# Patient Record
Sex: Male | Born: 1950 | Race: Black or African American | Hispanic: No | Marital: Single | Smoking: Current every day smoker
Health system: Southern US, Community
[De-identification: ages and names within clinical notes are randomized; demographics above are authoritative.]

## PROBLEM LIST (undated history)

## (undated) DIAGNOSIS — I10 Essential (primary) hypertension: Secondary | ICD-10-CM

## (undated) DIAGNOSIS — J449 Chronic obstructive pulmonary disease, unspecified: Secondary | ICD-10-CM

## (undated) DIAGNOSIS — J40 Bronchitis, not specified as acute or chronic: Secondary | ICD-10-CM

## (undated) DIAGNOSIS — R0600 Dyspnea, unspecified: Secondary | ICD-10-CM

## (undated) DIAGNOSIS — M1990 Unspecified osteoarthritis, unspecified site: Secondary | ICD-10-CM

## (undated) HISTORY — PX: NO PAST SURGERIES: SHX2092

---

## 1998-02-15 ENCOUNTER — Emergency Department (HOSPITAL_COMMUNITY): Admission: EM | Admit: 1998-02-15 | Discharge: 1998-02-15 | Payer: Self-pay | Admitting: Emergency Medicine

## 2020-07-08 ENCOUNTER — Ambulatory Visit: Payer: Self-pay | Attending: Internal Medicine

## 2020-07-08 DIAGNOSIS — Z23 Encounter for immunization: Secondary | ICD-10-CM

## 2020-07-08 NOTE — Progress Notes (Signed)
   Covid-19 Vaccination Clinic  Name:  Masahiro Iglesia    MRN: [id number] DOB: 1951-01-21  07/08/2020  Mr. Rabideau was observed post Covid-19 immunization for 15 minutes without incident. He was provided with Vaccine Information Sheet and instruction to access the V-Safe system.   Mr. Tanori was instructed to call 911 with any severe reactions post vaccine: Marland Kitchen Difficulty breathing  . Swelling of face and throat  . A fast heartbeat  . A bad rash all over body  . Dizziness and weakness   Immunizations Administered    Name Date Dose VIS Date Route   Pfizer COVID-19 Vaccine 07/08/2020  9:33 AM 0.3 mL 11/26/2018 Intramuscular   Manufacturer: [employer], [employer]   Lot: [id number]   NDC: [address]-0

## 2020-07-29 ENCOUNTER — Ambulatory Visit: Payer: Self-pay | Attending: Internal Medicine

## 2020-07-29 DIAGNOSIS — Z23 Encounter for immunization: Secondary | ICD-10-CM

## 2020-07-29 NOTE — Progress Notes (Signed)
   Covid-19 Vaccination Clinic  Name:  Evertt Chouinard    MRN: [id number] DOB: 12-21-1950  07/29/2020  Mr. Lowdermilk was observed post Covid-19 immunization for 15 minutes without incident. He was provided with Vaccine Information Sheet and instruction to access the V-Safe system.   Mr. Mayabb was instructed to call 911 with any severe reactions post vaccine: Marland Kitchen Difficulty breathing  . Swelling of face and throat  . A fast heartbeat  . A bad rash all over body  . Dizziness and weakness   Immunizations Administered    Name Date Dose VIS Date Route   Pfizer COVID-19 Vaccine 07/29/2020  9:14 AM 0.3 mL 11/26/2018 Intramuscular   Manufacturer: [employer], [employer]   Lot: [id number]   NDC: [address]-7

## 2021-02-07 DIAGNOSIS — Z20822 Contact with and (suspected) exposure to covid-19: Secondary | ICD-10-CM | POA: Diagnosis not present

## 2021-02-21 DIAGNOSIS — Z20822 Contact with and (suspected) exposure to covid-19: Secondary | ICD-10-CM | POA: Diagnosis not present

## 2021-03-03 DIAGNOSIS — Z20822 Contact with and (suspected) exposure to covid-19: Secondary | ICD-10-CM | POA: Diagnosis not present

## 2021-04-05 DIAGNOSIS — Z20822 Contact with and (suspected) exposure to covid-19: Secondary | ICD-10-CM | POA: Diagnosis not present

## 2021-04-07 DIAGNOSIS — Z20822 Contact with and (suspected) exposure to covid-19: Secondary | ICD-10-CM | POA: Diagnosis not present

## 2021-04-25 DIAGNOSIS — Z20822 Contact with and (suspected) exposure to covid-19: Secondary | ICD-10-CM | POA: Diagnosis not present

## 2021-05-02 DIAGNOSIS — Z20822 Contact with and (suspected) exposure to covid-19: Secondary | ICD-10-CM | POA: Diagnosis not present

## 2021-05-09 DIAGNOSIS — Z20822 Contact with and (suspected) exposure to covid-19: Secondary | ICD-10-CM | POA: Diagnosis not present

## 2021-06-20 DIAGNOSIS — Z20822 Contact with and (suspected) exposure to covid-19: Secondary | ICD-10-CM | POA: Diagnosis not present

## 2021-06-29 DIAGNOSIS — Z1152 Encounter for screening for COVID-19: Secondary | ICD-10-CM | POA: Diagnosis not present

## 2021-08-14 ENCOUNTER — Other Ambulatory Visit: Payer: Self-pay

## 2021-08-14 ENCOUNTER — Observation Stay (HOSPITAL_COMMUNITY)
Admission: EM | Admit: 2021-08-14 | Discharge: 2021-08-15 | Disposition: A | Discharge status: Home / Self Care | Payer: Medicare HMO | Attending: Internal Medicine | Admitting: Internal Medicine

## 2021-08-14 ENCOUNTER — Emergency Department (HOSPITAL_COMMUNITY): Payer: Medicare HMO

## 2021-08-14 ENCOUNTER — Encounter (HOSPITAL_COMMUNITY): Payer: Self-pay

## 2021-08-14 DIAGNOSIS — R030 Elevated blood-pressure reading, without diagnosis of hypertension: Secondary | ICD-10-CM | POA: Insufficient documentation

## 2021-08-14 DIAGNOSIS — J209 Acute bronchitis, unspecified: Secondary | ICD-10-CM | POA: Insufficient documentation

## 2021-08-14 DIAGNOSIS — R0902 Hypoxemia: Secondary | ICD-10-CM | POA: Diagnosis not present

## 2021-08-14 DIAGNOSIS — J441 Chronic obstructive pulmonary disease with (acute) exacerbation: Secondary | ICD-10-CM | POA: Diagnosis not present

## 2021-08-14 DIAGNOSIS — Z20822 Contact with and (suspected) exposure to covid-19: Secondary | ICD-10-CM | POA: Diagnosis not present

## 2021-08-14 DIAGNOSIS — J9601 Acute respiratory failure with hypoxia: Secondary | ICD-10-CM | POA: Diagnosis not present

## 2021-08-14 DIAGNOSIS — I10 Essential (primary) hypertension: Secondary | ICD-10-CM | POA: Diagnosis not present

## 2021-08-14 DIAGNOSIS — R0602 Shortness of breath: Secondary | ICD-10-CM | POA: Diagnosis present

## 2021-08-14 DIAGNOSIS — I700 Atherosclerosis of aorta: Secondary | ICD-10-CM | POA: Diagnosis not present

## 2021-08-14 DIAGNOSIS — R918 Other nonspecific abnormal finding of lung field: Secondary | ICD-10-CM | POA: Diagnosis not present

## 2021-08-14 LAB — RESPIRATORY PANEL BY PCR

## 2021-08-14 LAB — BASIC METABOLIC PANEL
Anion gap: 8 (ref 5–15)
BUN: 25 mg/dL — ABNORMAL HIGH (ref 8–23)
CO2: 30 mmol/L (ref 22–32)
Calcium: 9.7 mg/dL (ref 8.9–10.3)
Chloride: 103 mmol/L (ref 98–111)
Creatinine, Ser: 1.11 mg/dL (ref 0.61–1.24)
GFR, Estimated: >60 mL/min (ref >60)
Glucose, Bld: 114 mg/dL — ABNORMAL HIGH (ref 70–99)
Potassium: 3.6 mmol/L (ref 3.5–5.1)
Sodium: 141 mmol/L (ref 135–145)

## 2021-08-14 LAB — CBC WITH DIFFERENTIAL/PLATELET
Abs Immature Granulocytes: 0.01 10*3/uL (ref 0.00–0.07)
Basophils Absolute: 0 10*3/uL (ref 0.0–0.1)
Basophils Relative: 0 %
Eosinophils Absolute: 0.2 10*3/uL (ref 0.0–0.5)
Eosinophils Relative: 5 %
HCT: 52.5 % — ABNORMAL HIGH (ref 39.0–52.0)
Hemoglobin: 17.2 g/dL — ABNORMAL HIGH (ref 13.0–17.0)
Immature Granulocytes: 0 %
Lymphocytes Relative: 51 %
Lymphs Abs: 2.5 10*3/uL (ref 0.7–4.0)
MCH: 29.9 pg (ref 26.0–34.0)
MCHC: 32.8 g/dL (ref 30.0–36.0)
MCV: 91.3 fL (ref 80.0–100.0)
Monocytes Absolute: 0.5 10*3/uL (ref 0.1–1.0)
Monocytes Relative: 10 %
Neutro Abs: 1.6 10*3/uL — ABNORMAL LOW (ref 1.7–7.7)
Neutrophils Relative %: 34 %
Platelets: 312 10*3/uL (ref 150–400)
RBC: 5.75 MIL/uL (ref 4.22–5.81)
RDW: 14.4 % (ref 11.5–15.5)
WBC: 4.9 10*3/uL (ref 4.0–10.5)
nRBC: 0 % (ref 0.0–0.2)

## 2021-08-14 LAB — RESP PANEL BY RT-PCR (FLU A&B, COVID) ARPGX2
Influenza A by PCR: NEGATIVE
Influenza B by PCR: NEGATIVE
SARS Coronavirus 2 by RT PCR: NEGATIVE

## 2021-08-14 LAB — LIPID PANEL
Cholesterol: 210 mg/dL — ABNORMAL HIGH (ref 0–200)
HDL: 68 mg/dL (ref >40)
LDL Cholesterol: 135 mg/dL — ABNORMAL HIGH (ref 0–99)
Total CHOL/HDL Ratio: 3.1 RATIO
Triglycerides: 33 mg/dL (ref <150)
VLDL: 7 mg/dL (ref 0–40)

## 2021-08-14 LAB — LACTIC ACID, PLASMA: Lactic Acid, Venous: 1 mmol/L (ref 0.5–1.9)

## 2021-08-14 MED ORDER — POLYETHYLENE GLYCOL 3350 17 G PO PACK: 17.0000 g | PACK | Freq: Every day | ORAL | Status: DC | PRN

## 2021-08-14 MED ORDER — IPRATROPIUM-ALBUTEROL 0.5-2.5 (3) MG/3ML IN SOLN
3.0000 mL | Freq: Once | RESPIRATORY_TRACT | Status: AC
  Administered 2021-08-14: 3 mL via RESPIRATORY_TRACT
  Filled 2021-08-14: qty 3

## 2021-08-14 MED ORDER — NICOTINE 21 MG/24HR TD PT24
21.0000 mg | MEDICATED_PATCH | Freq: Every day | TRANSDERMAL | Status: DC
  Administered 2021-08-14 – 2021-08-15 (×2): 21 mg via TRANSDERMAL
  Filled 2021-08-14 (×2): qty 1

## 2021-08-14 MED ORDER — IPRATROPIUM-ALBUTEROL 0.5-2.5 (3) MG/3ML IN SOLN
3.0000 mL | RESPIRATORY_TRACT | Status: DC | PRN
  Administered 2021-08-15: 3 mL via RESPIRATORY_TRACT
  Filled 2021-08-14: qty 3

## 2021-08-14 MED ORDER — PREDNISONE 20 MG PO TABS
40.0000 mg | ORAL_TABLET | Freq: Every day | ORAL | Status: DC
  Administered 2021-08-15: 40 mg via ORAL
  Filled 2021-08-14: qty 2

## 2021-08-14 MED ORDER — SODIUM CHLORIDE 0.9% FLUSH
3.0000 mL | Freq: Two times a day (BID) | INTRAVENOUS | Status: DC
  Administered 2021-08-14 – 2021-08-15 (×2): 3 mL via INTRAVENOUS

## 2021-08-14 MED ORDER — DOXYCYCLINE HYCLATE 100 MG PO TABS: 100.0000 mg | ORAL_TABLET | Freq: Two times a day (BID) | ORAL | Status: DC

## 2021-08-14 MED ORDER — SODIUM CHLORIDE 0.9 % IV SOLN
100.0000 mg | Freq: Once | INTRAVENOUS | Status: AC
  Administered 2021-08-14: 100 mg via INTRAVENOUS
  Filled 2021-08-14: qty 100

## 2021-08-14 MED ORDER — ALUM & MAG HYDROXIDE-SIMETH 200-200-20 MG/5ML PO SUSP
30.0000 mL | ORAL | Status: DC | PRN
  Administered 2021-08-14: 30 mL via ORAL
  Filled 2021-08-14: qty 30

## 2021-08-14 MED ORDER — ACETAMINOPHEN 325 MG PO TABS
650.0000 mg | ORAL_TABLET | Freq: Four times a day (QID) | ORAL | Status: DC | PRN
  Administered 2021-08-14: 650 mg via ORAL
  Filled 2021-08-14: qty 2

## 2021-08-14 MED ORDER — GUAIFENESIN 200 MG PO TABS
200.0000 mg | ORAL_TABLET | ORAL | Status: DC | PRN
  Administered 2021-08-15: 200 mg via ORAL
  Filled 2021-08-14: qty 1

## 2021-08-14 MED ORDER — ENOXAPARIN SODIUM 40 MG/0.4ML IJ SOSY
40.0000 mg | PREFILLED_SYRINGE | INTRAMUSCULAR | Status: DC
  Administered 2021-08-14: 40 mg via SUBCUTANEOUS
  Filled 2021-08-14: qty 0.4

## 2021-08-14 MED ORDER — DOXYCYCLINE HYCLATE 100 MG PO TABS
100.0000 mg | ORAL_TABLET | Freq: Two times a day (BID) | ORAL | Status: DC
  Administered 2021-08-14 – 2021-08-15 (×2): 100 mg via ORAL
  Filled 2021-08-14 (×2): qty 1

## 2021-08-14 MED ORDER — METHYLPREDNISOLONE SODIUM SUCC 125 MG IJ SOLR
125.0000 mg | Freq: Once | INTRAMUSCULAR | Status: AC
  Administered 2021-08-14: 125 mg via INTRAVENOUS
  Filled 2021-08-14: qty 2

## 2021-08-14 MED ORDER — ACETAMINOPHEN 650 MG RE SUPP: 650.0000 mg | Freq: Four times a day (QID) | RECTAL | Status: DC | PRN

## 2021-08-14 NOTE — ED Triage Notes (Signed)
Pt states that for the past week he has been having SOB, coughing up green phlegm, speaks in short sentences, pt was 86% on RA, placed on 2L, pain that radiates to back and palpations

## 2021-08-14 NOTE — H&P (Signed)
Date: 08/14/2021               Patient Name:  Bryan Young MRN: [id number]  DOB: 1950-11-05 Age / Sex: 70 y.o., male   PCP: Pcp, No         Medical Service: Internal Medicine Teaching Service         Attending Physician: Dr. Oswaldo Done, Marquita Palms, *    First Contact: Dellis Filbert, MD Pager: SA [phone]  Second Contact: Doran Stabler, DO Pager: Gennaro Africa [phone]       After Hours (After 5p/  First Contact Pager: [phone]  weekends / holidays): Second Contact Pager: [phone]   SUBJECTIVE  Chief Complaint: [facility]  History of Present Illness: Bryan Young is a 70 y.o. male with no known PMH, who presents to [facility] with a 1 week history of SHOB and productive cough.  Patient states that he developed shortness of breath over the last week.  This shortness of breath is associated with increased cough and sputum production more than his baseline.  He denies any significant fevers, chills, myalgias, but states his cough is so bad that it makes it difficult for him to talk.  He denies any sick contacts but states that he lives in a boardinghouse with other people close by.  He has never had anything like this happen before but believes it is related to his history of tobacco use.  He states that he smokes a pack and a half a day for the last 70 years.  He recently switched to a different brand of cigarettes this week and feels that it has irritated his breathing.  Otherwise he denies any other precipitating factors.  He denies any chest pain, palpitations, abdominal pain, diarrhea, dysuria, hematuria, arthralgias, myalgias, fevers, chills, recent travel, changes in medications.   Medications: No current facility-administered medications on file prior to encounter.   Current Outpatient Medications on File Prior to Encounter  Medication Sig Dispense Refill   guaiFENesin (ROBITUSSIN) 100 MG/5ML liquid Take 10 mLs by mouth every 4 (four) hours as needed for cough or to loosen phlegm.      Pseudoephedrine-APAP-DM (DAYQUIL PO) Take 1 Dose by mouth 2 (two) times daily as needed (cold symptoms).      Past Medical History: History reviewed. No pertinent past medical history.  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  Allergies: Allergies as of 08/14/2021   (No Known Allergies)    Review of Systems: A complete ROS was negative except as per HPI.   OBJECTIVE:  Physical Exam: Blood pressure 132/84, pulse 76, temperature 97.7 F (36.5 C), temperature source Oral, resp. rate 14, SpO2 95 %. Physical Exam Constitutional:      General: He is not in acute distress.    Appearance: He is not ill-appearing or diaphoretic.  HENT:     Head: Normocephalic and atraumatic.     Mouth/Throat:     Mouth: Mucous membranes are moist.     Pharynx: Oropharynx is clear.  Eyes:     Extraocular Movements: Extraocular movements intact.  Cardiovascular:     Rate and Rhythm: Normal rate and regular rhythm.     Pulses: Normal pulses.     Heart sounds: Normal heart sounds.  Pulmonary:     Effort: Pulmonary effort is normal. No respiratory  distress.     Breath sounds: Wheezing (mild expiratory wheezing) present.  Chest:     Chest wall: No tenderness.  Abdominal:     General: There is no distension.     Palpations: Abdomen is soft.     Tenderness: There is no abdominal tenderness.  Musculoskeletal:        General: No swelling. Normal range of motion.     Cervical back: Normal range of motion. No rigidity.  Skin:    General: Skin is warm and dry.  Neurological:     General: No focal deficit present.     Mental Status: He is alert and oriented to person, place, and time.    Pertinent Labs: CBC    Component Value Date/Time   WBC 4.9 08/14/2021 0607   RBC 5.75 08/14/2021 0607   HGB 17.2 (H) 08/14/2021  0607   HCT 52.5 (H) 08/14/2021 0607   PLT 312 08/14/2021 0607   MCV 91.3 08/14/2021 0607   MCH 29.9 08/14/2021 0607   MCHC 32.8 08/14/2021 0607   RDW 14.4 08/14/2021 0607   LYMPHSABS 2.5 08/14/2021 0607   MONOABS 0.5 08/14/2021 0607   EOSABS 0.2 08/14/2021 0607   BASOSABS 0.0 08/14/2021 0607     CMP     Component Value Date/Time   NA 141 08/14/2021 0607   K 3.6 08/14/2021 0607   CL 103 08/14/2021 0607   CO2 30 08/14/2021 0607   GLUCOSE 114 (H) 08/14/2021 0607   BUN 25 (H) 08/14/2021 0607   CREATININE 1.11 08/14/2021 0607   CALCIUM 9.7 08/14/2021 0607   GFRNONAA >60 08/14/2021 0607    Pertinent Imaging: DG Chest 2 View  Result Date: 08/14/2021 1. Mild diffuse peribronchial cuffing which could suggest an acute bronchitis.  2. Hyperexpansion of the lungs with probable emphysematous changes.  EKG: personally reviewed my interpretation is normal sinus rhythm, LVH  ASSESSMENT & PLAN:  Assessment: Active Problems:   COPD exacerbation (HCC)   Bryan Young is a 70 y.o. with no known PMH who presented with SHOB and cough with sputum and admit for COPD exacerbation on hospital day 0  Plan: #COPD Exacerbation: Patient has at least 2 out of 3 of the cardinal symptoms for COPD exacerbation with a new supplemental oxygen requirement of 2 L to maintain saturations greater than 90.  Chest x-ray does show evidence of emphysema and possible acute bronchitis.  This started on steroids and Doxy in the ED. - Prednisone 40 mg 1/5 - Doxy 1/5 - Viral Resp panel pending. - Bcx drawn in the ED - Continue duonebs -We will start Mucinex and encourage incentive spirometer. -Supplemental O2 as needed to maintain stats above 89% - Ambulate with pulseOx tomorrow  #Risk of ASCVD: - lipid panel pending    Best Practice: Diet: Regular diet IVF: Fluids: none, Rate: None VTE: enoxaparin (LOVENOX) injection 40 mg Start: 08/14/21 1015 Code: Full AB: Doxy Status: Observation with expected  length of stay less than 2 midnights. Anticipated Discharge Location: Home Barriers to Discharge: Medical stability  Signature: Lawerance Cruel, D.O.  Internal Medicine Resident, PGY-3 [facility]  Pager: [phone] 10:15 AM, 08/14/2021   Please contact the on call pager after 5 pm and on weekends at [phone].

## 2021-08-14 NOTE — ED Notes (Signed)
Dr. Wallace Cullens placed an order to check pulse oximeter while ambulating. Oxyegen was turned off upon entering the room. SpO2 on RA wile sitting was 87%. 2 L [place] placed back on pt at this time. Will update Dr. Wallace Cullens now.

## 2021-08-14 NOTE — ED Notes (Signed)
Pt placed on 2 l oxygen

## 2021-08-14 NOTE — Hospital Course (Signed)
COPD smoke 70 years tobacco Hypoxic  Cough 2L Wheezy

## 2021-08-14 NOTE — ED Provider Notes (Signed)
[facility] EMERGENCY DEPARTMENT Provider Note   CSN: [id number] Arrival date & time: 08/14/21  5852     History Chief Complaint  Patient presents with   Shortness of Breath    Bryan Young is a 70 y.o. male.  The history is provided by the patient.  Shortness of Breath He complains of cough and dyspnea for the last week, getting worse.  Cough is productive of green sputum.  Symptoms are worse at night.  He denies fever, chills, sweats.  Cough and dyspnea have gotten to the point where he has been unable to smoke for the last 3 days.  He denies any sick contacts.  He has been vaccinated against COVID-19, but did not receive his influenza vaccination this year.   History reviewed. No pertinent past medical history.  There are no problems to display for this patient.   History reviewed. No pertinent surgical history.     No family history on file.     Home Medications Prior to Admission medications   Not on File    Allergies    Patient has no known allergies.  Review of Systems   Review of Systems  Respiratory:  Positive for shortness of breath.   All other systems reviewed and are negative.  Physical Exam Updated Vital Signs BP (!) 143/111   Pulse (!) 111   Temp 97.7 F (36.5 C) (Oral)   Resp 16   SpO2 95%   Physical Exam Vitals and nursing note reviewed.  70 year old male, resting comfortably and in no acute distress. Vital signs are significant for elevated heart rate and blood pressure. Oxygen saturation is 87%, which is hypoxic, but improved when he was placed on supplemental oxygen. Head is normocephalic and atraumatic. PERRLA, EOMI. Oropharynx is clear. Neck is nontender and supple without adenopathy or JVD. Back is nontender and there is no CVA tenderness. Lungs have coarse breath sounds throughout with scattered expiratory wheezes.  There are no rales or rhonchi moves all extremities equally. Chest is nontender. Heart has  regular rate and rhythm without murmur. Abdomen is soft, flat, nontender. Extremities have no cyanosis or edema, full range of motion is present. Skin is warm and dry without rash. Neurologic: Mental status is normal, cranial nerves are intact, there are no motor or sensory deficits.  ED Results / Procedures / Treatments   Labs (all labs ordered are listed, but only abnormal results are displayed) Labs Reviewed  CBC WITH DIFFERENTIAL/PLATELET - Abnormal; Notable for the following components:      Result Value   Hemoglobin 17.2 (*)    HCT 52.5 (*)    Neutro Abs 1.6 (*)    All other components within normal limits  BASIC METABOLIC PANEL - Abnormal; Notable for the following components:   Glucose, Bld 114 (*)    BUN 25 (*)    All other components within normal limits  RESP PANEL BY RT-PCR (FLU A&B, COVID) [id number]  CULTURE, BLOOD (ROUTINE X 2)  CULTURE, BLOOD (ROUTINE X 2)  LACTIC ACID, PLASMA    EKG EKG Interpretation  Date/Time:  Sunday August 14 2021 05:39:48 EST Ventricular Rate:  98 PR Interval:  178 QRS Duration: 90 QT Interval:  376 QTC Calculation: 480 R Axis:   81 Text Interpretation: Normal sinus rhythm Biatrial enlargement Minimal voltage criteria for LVH, may be normal variant ( Sokolow-Lyon ) Prolonged QT Abnormal ECG No old tracing to compare Confirmed by Dione Booze ([id number]) on 08/14/2021 5:42:40 AM  Radiology DG Chest 2 View  Result Date: 08/14/2021 CLINICAL DATA:  70 year old male with history of hypoxia. EXAM: CHEST - 2 VIEW COMPARISON:  No priors. FINDINGS: Lung volumes are increased, with probable emphysematous changes. Diffuse peribronchial cuffing. No consolidative airspace disease. No pleural effusions. No pneumothorax. No pulmonary nodule or mass noted. Pulmonary vasculature and the cardiomediastinal silhouette are within normal limits. Atherosclerosis in the thoracic aorta. IMPRESSION: 1. Mild diffuse peribronchial cuffing which could suggest an acute  bronchitis. 2. Hyperexpansion of the lungs with probable emphysematous changes. Electronically Signed   By: Trudie Reed M.D.   On: 08/14/2021 06:04    Procedures Procedures   Medications Ordered in ED Medications  ipratropium-albuterol (DUONEB) 0.5-2.5 (3) MG/3ML nebulizer solution 3 mL (has no administration in time range)  methylPREDNISolone sodium succinate (SOLU-MEDROL) 125 mg/2 mL injection 125 mg (has no administration in time range)    ED Course  I have reviewed the triage vital signs and the nursing notes.  Pertinent labs & imaging results that were available during my care of the patient were reviewed by me and considered in my medical decision making (see chart for details).    MDM Rules/Calculators/A&P                         Cough and dyspnea with wheezing which seem consistent with COPD exacerbation.  Chest x-ray shows no evidence of pneumonia.  He did have initial hypoxia and was placed on supplemental oxygen.  Respiratory pathogen panel has been sent.  He will be given dose of methylprednisolone and a nebulizer treatment with albuterol and ipratropium.  He has no prior records in the [facility].  Labs show polycythemia which is consistent with COPD.  Following nebulizer treatment, he states he feels better but there is still some wheezing and coarse breath sounds noted.  He will be given a second nebulizer treatment.  He will need to be assessed as to whether he continues to be hypoxic after nebulizer treatments.  Case is signed out to Dr. Wallace Cullens.  Final Clinical Impression(s) / ED Diagnoses Final diagnoses:  Acute bronchitis, unspecified organism  Elevated blood pressure reading without diagnosis of hypertension    Rx / DC Orders ED Discharge Orders     None        Dione Booze, MD 08/14/21 [phone]

## 2021-08-14 NOTE — ED Provider Notes (Signed)
  Provider Note MRN:  [id number]  Arrival date & time: 08/14/21    ED Course and Medical Decision Making  Assumed care from Dr Preston Fleeting at shift change.  See not from prior team for complete details, in brief: hx tobacco use, arrived wheezing, hypoxic 86%, cough; concern for possible COPD. CXR w/o PNA. Placed on supplemental oxygen, given nebs/steroids.   Plan per prior physician Repeat neb treatment, if remains hypoxic plan admit as he does not have home oxygen. If ambulatory without hypoxia then favor discharge with o/p f/u  Pt re-assessed, he is hypoxic on room air at 86%, unable to assess ambulatory pulse ox as hypoxic at rest after multiple nebs. Start doxy. Recommend admission for hypoxia, likely COPD exacerbation, pt agreeable.    .Critical Care Performed by: Sloan Leiter, DO Authorized by: Sloan Leiter, DO   Critical care provider statement:    Critical care time (minutes):  32   Critical care time was exclusive of:  Separately billable procedures and treating other patients   Critical care was necessary to treat or prevent imminent or life-threatening deterioration of the following conditions:  Respiratory failure   Critical care was time spent personally by me on the following activities:  Development of treatment plan with patient or surrogate, discussions with consultants, evaluation of patient's response to treatment, examination of patient, ordering and review of laboratory studies, ordering and review of radiographic studies, ordering and performing treatments and interventions, pulse oximetry, re-evaluation of patient's condition and review of old charts   Care discussed with: admitting provider    Final Clinical Impressions(s) / ED Diagnoses     ICD-10-CM   1. Acute bronchitis, unspecified organism  J20.9     2. Elevated blood pressure reading without diagnosis of hypertension  R03.0     3. Acute respiratory failure with hypoxia [facility])  J96.01       ED Discharge  Orders     None       Discharge Instructions   None         Sloan Leiter, DO 08/14/21 0915

## 2021-08-15 ENCOUNTER — Other Ambulatory Visit (HOSPITAL_COMMUNITY): Payer: Self-pay

## 2021-08-15 DIAGNOSIS — J441 Chronic obstructive pulmonary disease with (acute) exacerbation: Secondary | ICD-10-CM | POA: Diagnosis not present

## 2021-08-15 LAB — CBC
HCT: 44.4 % (ref 39.0–52.0)
Hemoglobin: 15 g/dL (ref 13.0–17.0)
MCH: 30.1 pg (ref 26.0–34.0)
MCHC: 33.8 g/dL (ref 30.0–36.0)
MCV: 89.2 fL (ref 80.0–100.0)
Platelets: 291 10*3/uL (ref 150–400)
RBC: 4.98 MIL/uL (ref 4.22–5.81)
RDW: 14.1 % (ref 11.5–15.5)
WBC: 6.2 10*3/uL (ref 4.0–10.5)
nRBC: 0 % (ref 0.0–0.2)

## 2021-08-15 LAB — BASIC METABOLIC PANEL
Anion gap: 8 (ref 5–15)
BUN: 22 mg/dL (ref 8–23)
CO2: 24 mmol/L (ref 22–32)
Calcium: 9.7 mg/dL (ref 8.9–10.3)
Chloride: 104 mmol/L (ref 98–111)
Creatinine, Ser: 1.06 mg/dL (ref 0.61–1.24)
GFR, Estimated: >60 mL/min (ref >60)
Glucose, Bld: 107 mg/dL — ABNORMAL HIGH (ref 70–99)
Potassium: 4.9 mmol/L (ref 3.5–5.1)
Sodium: 136 mmol/L (ref 135–145)

## 2021-08-15 MED ORDER — CHRONIC LUNG DISEASE PATIENT EDUCATION BOOK
Freq: Once | Status: AC
  Filled 2021-08-15: qty 1

## 2021-08-15 MED ORDER — DOXYCYCLINE HYCLATE 100 MG PO TABS
100.0000 mg | ORAL_TABLET | Freq: Every day | ORAL | 0 refills | Status: DC
  Filled 2021-08-15: qty 8, 8d supply, fill #0

## 2021-08-15 MED ORDER — PREDNISONE 20 MG PO TABS
40.0000 mg | ORAL_TABLET | Freq: Every day | ORAL | 0 refills | Status: AC
  Filled 2021-08-15: qty 8, 4d supply, fill #0

## 2021-08-15 MED ORDER — ALBUTEROL SULFATE HFA 108 (90 BASE) MCG/ACT IN AERS
2.0000 | INHALATION_SPRAY | Freq: Four times a day (QID) | RESPIRATORY_TRACT | 2 refills | Status: AC | PRN
  Filled 2021-08-15 – 2021-08-31 (×2): qty 8.5, 25d supply, fill #0

## 2021-08-15 MED ORDER — SPIRIVA RESPIMAT 2.5 MCG/ACT IN AERS
2.0000 | INHALATION_SPRAY | Freq: Every day | RESPIRATORY_TRACT | 0 refills | Status: DC
  Filled 2021-08-15: qty 4, 30d supply, fill #0

## 2021-08-15 NOTE — Plan of Care (Signed)
?  Problem: Respiratory: ?Goal: Levels of oxygenation will improve ?Outcome: Progressing ?Goal: Ability to maintain adequate ventilation will improve ?Outcome: Progressing ?  ?

## 2021-08-15 NOTE — Discharge Summary (Addendum)
Name: Bryan Young MRN: [id number] DOB: 1951/02/21 70 y.o. PCP: Pcp, No  Date of Admission: 08/14/2021  5:29 AM Date of Discharge: 08/15/21 Attending Physician: Miguel Aschoff, MD  Discharge Diagnosis: 1. Acute bronchitis with COPD exacerbation   Discharge Medications: Allergies as of 08/15/2021   No Known Allergies      Medication List     TAKE these medications    albuterol 108 (90 Base) MCG/ACT inhaler Commonly known as: VENTOLIN HFA Inhale 2 puffs into the lungs every 6 (six) hours as needed for wheezing or shortness of breath.   DAYQUIL PO Take 1 Dose by mouth 2 (two) times daily as needed (cold symptoms).   doxycycline 50 MG tablet Commonly known as: ADOXA Take 2 tablets (100 mg total) by mouth daily for 4 days.   guaiFENesin 100 MG/5ML liquid Commonly known as: ROBITUSSIN Take 10 mLs by mouth every 4 (four) hours as needed for cough or to loosen phlegm.   predniSONE 20 MG tablet Commonly known as: DELTASONE Take 2 tablets (40 mg total) by mouth daily with breakfast for 4 days.   Spiriva Respimat 2.5 MCG/ACT Aers Generic drug: Tiotropium Bromide Monohydrate Inhale 2 puffs into the lungs daily.        Disposition and follow-up:   [name] was discharged from [facility] in Good condition.  At the hospital follow up visit please address:  1.  Acute bronchitis with COPD exacerbation-please finish taking your antibiotics and steroid medications.  Use your Spiriva inhaler 2 puffs daily.  Use your albuterol inhaler as needed and you feel short of breath.  Follow-up with internal medicine clinic here at [facility] health in 1 week.  Discuss stopping smoking!  2.  Labs / imaging needed at time of follow-up: none  3.  Pending labs/ test needing follow-up: none  Follow-up Appointments:  Follow-up Information     [facility] Follow up in 1 week(s).   Contact information: [address] [place] [address] [phone]                Hospital Course by problem list: 1. COPD exacerbation- patient presented c/o SOB and productive cough worse than baseline. Mild expiratory wheezing noted on physical exam. CXR demonstrates flattened diaphragm and hyperinflation of the lungs consistent with COPD; mild diffuse peribronchial cuffing which could suggest an acute bronchitis and hyperexpansion of the lungs with probable emphysematous changes. Respiratory panel- negative; CBC significant for elevated H-H which is commonly seen in long-term smokers; WBC WNL. Blood cultures show no growth; Patient remained afebrile and normotensive throughout hospital course. Patient received doxycyline 100mg ; ipratropium duoneb; methylprednisone 125mg  injection once and then started on prednisone tablet 40mg . Patient was also placed on Nasal canula 2L. Patient has since improved following management. Patient currently sating well on room air.  Discharge Exam:   BP 119/82 (BP Location: Left Arm)   Pulse 85   Temp 97.7 F (36.5 C) (Oral)   Resp 16   Ht 6' (1.829 m)   Wt 75.8 kg   SpO2 96%   BMI 22.65 kg/m  Discharge exam: Physical Exam Constitutional:      General: He is not in acute distress. HENT:     Head: Normocephalic and atraumatic.  Cardiovascular:     Rate and Rhythm: Normal rate and regular rhythm.     Heart sounds: Normal heart sounds.  Pulmonary:     Effort: Pulmonary effort is normal.  Breath sounds: Normal breath sounds and air entry.  Skin:    General: Skin is warm and dry.  Neurological:     General: No focal deficit present.     Mental Status: He is alert and oriented to person, place, and time. Mental status is at baseline.  Psychiatric:        Behavior: Behavior normal. Behavior is cooperative.     Pertinent Labs, Studies, and Procedures:  CBC Latest Ref Rng & Units 08/15/2021 08/14/2021  WBC 4.0 - 10.5 K/uL 6.2 4.9  Hemoglobin 13.0 - 17.0 g/dL 15.0 17.2(H)   Hematocrit 39.0 - 52.0 % 44.4 52.5(H)  Platelets 150 - 400 K/uL 291 312   CMP Latest Ref Rng & Units 08/15/2021 08/14/2021  Glucose 70 - 99 mg/dL 107(H) 114(H)  BUN 8 - 23 mg/dL 22 25(H)  Creatinine 0.61 - 1.24 mg/dL 1.06 1.11  Sodium 135 - 145 mmol/L 136 141  Potassium 3.5 - 5.1 mmol/L 4.9 3.6  Chloride 98 - 111 mmol/L 104 103  CO2 22 - 32 mmol/L 24 30  Calcium 8.9 - 10.3 mg/dL 9.7 9.7   BMP Latest Ref Rng & Units 08/15/2021 08/14/2021  Glucose 70 - 99 mg/dL 107(H) 114(H)  BUN 8 - 23 mg/dL 22 25(H)  Creatinine 0.61 - 1.24 mg/dL 1.06 1.11  Sodium 135 - 145 mmol/L 136 141  Potassium 3.5 - 5.1 mmol/L 4.9 3.6  Chloride 98 - 111 mmol/L 104 103  CO2 22 - 32 mmol/L 24 30  Calcium 8.9 - 10.3 mg/dL 9.7 9.7   Lipid Panel     Component Value Date/Time   CHOL 210 (H) 08/14/2021 1754   TRIG 33 08/14/2021 1754   HDL 68 08/14/2021 1754   CHOLHDL 3.1 08/14/2021 1754   VLDL 7 08/14/2021 1754   LDLCALC 135 (H) 08/14/2021 1754   CHEST - 2 VIEW   COMPARISON:  No priors.   FINDINGS: Lung volumes are increased, with probable emphysematous changes. Diffuse peribronchial cuffing. No consolidative airspace disease. No pleural effusions. No pneumothorax. No pulmonary nodule or mass noted. Pulmonary vasculature and the cardiomediastinal silhouette are within normal limits. Atherosclerosis in the thoracic aorta.   IMPRESSION: 1. Mild diffuse peribronchial cuffing which could suggest an acute bronchitis. 2. Hyperexpansion of the lungs with probable emphysematous changes.     Discharge Instructions: Discharge Instructions     Call MD for:  difficulty breathing, headache or visual disturbances   Complete by: As directed    Call MD for:  extreme fatigue   Complete by: As directed    Call MD for:  hives   Complete by: As directed    Call MD for:  persistant dizziness or light-headedness   Complete by: As directed    Call MD for:  persistant nausea and vomiting   Complete by: As  directed    Call MD for:  redness, tenderness, or signs of infection (pain, swelling, redness, odor or green/yellow discharge around incision site)   Complete by: As directed    Call MD for:  severe uncontrolled pain   Complete by: As directed    Call MD for:  temperature >100.4   Complete by: As directed    Diet - low sodium heart healthy   Complete by: As directed    Discharge instructions   Complete by: As directed    Please come to your follow-up appointment here at [facility] health internal medicine clinic in 1 week.  Please complete your antibiotics.  You are to take  2 pills daily for the next 4 days.  Please complete your steroid pills.  You are to take 2 pills daily for the next 4 days.  Your Spiriva inhaler take 2 puffs daily.  Your albuterol inhaler only use when you feel short of breath.   Increase activity slowly   Complete by: As directed    Increase activity slowly   Complete by: As directed        Signed: Timothy Lasso, MD 08/15/2021, 12:25 PM   Pager: [phone]

## 2021-08-15 NOTE — Discharge Instructions (Signed)
Please come to your follow-up appointment here at [facility] health internal medicine clinic in 1 week.  Please complete your antibiotics.  You are to take 2 pills daily for the next 4 days.  Please complete your steroid pills.  You are to take 2 pills daily for the next 4 days.  Your Spiriva inhaler take 2 puffs daily.  Your albuterol inhaler only use when you feel short of breath.

## 2021-08-15 NOTE — Progress Notes (Signed)
SATURATION QUALIFICATIONS: (This note is used to comply with regulatory documentation for home oxygen)  Patient Saturations on Room Air at Rest = 96%  Patient Saturations on Room Air while Ambulating = 87%  Patient Saturations on 2 Liters of oxygen while Ambulating = 97%  Please briefly explain why patient needs home oxygen:  Pt requires 2L supplemental O2 via [place] to maintain appropriate oxygen levels for ambulation and stair climbing.  Masiel Gentzler B. Beverely Risen PT, DPT Acute Rehabilitation Services Pager [phone] Office [phone]

## 2021-08-15 NOTE — Care Management (Signed)
1312 08-15-21 Patient in need of oxygen to transition home safely. Case Manager spoke with patient and he is agreeable to home oxygen. Case Manager made the referral to [employer] for oxygen and the durable medical equipment will be delivered to the home. No further needs from Case Manager at this time.

## 2021-08-15 NOTE — Evaluation (Signed)
Occupational Therapy Evaluation Patient Details Name: Bryan Young MRN: [id number] DOB: [birth year] Today's Date: 08/15/2021   History of Present Illness 70 y.o. male presents to [facility] 08/14/21 with a 1 week of SoB and productive cough arrived wheezing, hypoxic 86% requiring 2L O2 via [place] to maintain O2 saturation > 90%O2, cough; concern for possible COPD admitted for observation. CXR w/o PNA. Placed on supplemental oxygen, given nebs/steroids hx tobacco use   Clinical Impression   Pt presents with decreased activity tolerance, however demonstrated ability to safely/independently complete ADLs and functional transfers/mobility during eval. Pt O2 sats 98% on RA on arrival, dropping to 92% with functional activity and recovering to 99% after rest. Pt reports that although he felt SOB at admission, he feels he is back to his baseline now. Should be safe to return home without any further skilled OT services once medically cleared. Will sign off.     Recommendations for follow up therapy are one component of a multi-disciplinary discharge planning process, led by the attending physician.  Recommendations may be updated based on patient status, additional functional criteria and insurance authorization.   Follow Up Recommendations  No OT follow up    Assistance Recommended at Discharge None  Functional Status Assessment  Patient has had a recent decline in their functional status and demonstrates the ability to make significant improvements in function in a reasonable and predictable amount of time.  Equipment Recommendations  None recommended by OT    Recommendations for Other Services       Precautions / Restrictions Precautions Precautions: Fall;Other (comment) Precaution Comments: monitor O2 Restrictions Weight Bearing Restrictions: No      Mobility Bed Mobility               General bed mobility comments: In recliner on arrival    Transfers Overall transfer level:  Independent Equipment used: None                      Balance Overall balance assessment: No apparent balance deficits (not formally assessed)                                         ADL either performed or assessed with clinical judgement   ADL Overall ADL's : Independent;At baseline                                       General ADL Comments: Maintained O2 >90% while completing independent ADLs during eval without any other safety concerns.     Vision Baseline Vision/History: 1 Wears glasses       Perception     Praxis      Pertinent Vitals/Pain Pain Assessment: No/denies pain     Hand Dominance     Extremity/Trunk Assessment Upper Extremity Assessment Upper Extremity Assessment: Overall WFL for tasks assessed   Lower Extremity Assessment Lower Extremity Assessment: Defer to PT evaluation       Communication Communication Communication: No difficulties   Cognition Arousal/Alertness: Awake/alert Behavior During Therapy: WFL for tasks assessed/performed Overall Cognitive Status: Within Functional Limits for tasks assessed  General Comments       Exercises     Shoulder Instructions      Home Living Family/patient expects to be discharged to:: Other (Comment) (Boarding house) Living Arrangements: Other (Comment) (Boarding house) Available Help at Discharge: Neighbor Type of Home: House Home Access: Level entry     Home Layout: Able to live on main level with bedroom/bathroom     Bathroom Shower/Tub: Chief Strategy Officer: Standard     Home Equipment: Shower seat;Grab bars - toilet          Prior Functioning/Environment Prior Level of Function : Independent/Modified Independent;Working/employed;Driving                        OT Problem List: Decreased activity tolerance;Decreased knowledge of precautions;Cardiopulmonary  status limiting activity      OT Treatment/Interventions:      OT Goals(Current goals can be found in the care plan section) Acute Rehab OT Goals Patient Stated Goal: return home OT Goal Formulation: With patient  OT Frequency:     Barriers to D/C:            Co-evaluation              AM-PAC OT "6 Clicks" Daily Activity     Outcome Measure Help from another person eating meals?: None Help from another person taking care of personal grooming?: None Help from another person toileting, which includes using toliet, bedpan, or urinal?: None Help from another person bathing (including washing, rinsing, drying)?: None Help from another person to put on and taking off regular upper body clothing?: None Help from another person to put on and taking off regular lower body clothing?: None 6 Click Score: 24   End of Session Equipment Utilized During Treatment: Gait belt Nurse Communication: Mobility status  Activity Tolerance: Patient tolerated treatment well Patient left: in chair;with call bell/phone within reach  OT Visit Diagnosis: Other abnormalities of gait and mobility (R26.89)                Time: 2334-3568 OT Time Calculation (min): 19 min Charges:  OT General Charges $OT Visit: 1 Visit OT Evaluation $OT Eval Low Complexity: 1 Low  Diarra Ceja C, OT/L  Acute Rehab [phone]  Lenice Llamas 08/15/2021, 9:35 AM

## 2021-08-15 NOTE — Evaluation (Addendum)
Physical Therapy Evaluation and Discharge Patient Details Name: Bryan Young MRN: [id number] DOB: 1951-08-07 Today's Date: 08/15/2021  History of Present Illness  70 y.o. male presents to [facility] 08/14/21 with a 1 week of SoB and productive cough arrived wheezing, hypoxic 86% requiring 2L O2 via [place] to maintain O2 saturation > 90%O2, cough; concern for possible COPD admitted for observation. CXR w/o PNA. Placed on supplemental oxygen, given nebs/steroids hx tobacco use  Clinical Impression  Patient evaluated by Physical Therapy with no further acute PT needs identified. All education has been completed and the patient has no further questions. Pt is independent with mobility and ADLs. Pt will need supplemental O2 for mobility however has no Physical Therapy or equipment needs. PT is signing off. Thank you for this referral.        Recommendations for follow up therapy are one component of a multi-disciplinary discharge planning process, led by the attending physician.  Recommendations may be updated based on patient status, additional functional criteria and insurance authorization.  Follow Up Recommendations No PT follow up    Assistance Recommended at Discharge None  Functional Status Assessment Patient has not had a recent decline in their functional status  Equipment Recommendations  None recommended by PT    Recommendations for Other Services       Precautions / Restrictions Precautions Precautions: Fall;Other (comment) Precaution Comments: monitor O2 Restrictions Weight Bearing Restrictions: No      Mobility  Bed Mobility               General bed mobility comments: sitting EoB on entry (Simultaneous filing. User may not have seen previous data.)    Transfers Overall transfer level: Independent (Simultaneous filing. User may not have seen previous data.) Equipment used: None                    Ambulation/Gait Ambulation/Gait assistance: Independent Gait  Distance (Feet): 200 Feet Assistive device: None   Gait velocity: WFL Gait velocity interpretation: >4.37 ft/sec, indicative of normal walking speed   General Gait Details: WFL  Stairs Stairs: Yes Stairs assistance: Modified independent (Device/Increase time) Stair Management: Alternating pattern;One rail Left;Forwards Number of Stairs: 4 General stair comments: strong, steady, ascent, descent         Balance Overall balance assessment: Independent (Simultaneous filing. User may not have seen previous data.)                                           Pertinent Vitals/Pain Pain Assessment: No/denies pain (Simultaneous filing. User may not have seen previous data.)    Home Living Family/patient expects to be discharged to:: Group home Living Arrangements: Other (Comment) (Boarding house) Available Help at Discharge: Neighbor Type of Home: House Home Access: Stairs to enter (Simultaneous filing. User may not have seen previous data.)   Entrance Stairs-Number of Steps: 4   Home Layout: One level (Simultaneous filing. User may not have seen previous data.) Home Equipment: Cane - quad;Tub bench (Simultaneous filing. User may not have seen previous data.)      Prior Function Prior Level of Function : Independent/Modified Independent (Simultaneous filing. 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 User may not have seen previous data.)    Lower Extremity Assessment Lower Extremity Assessment: Overall WFL for tasks assessed (Simultaneous filing. User may not have seen previous data.)    Cervical / Trunk Assessment Cervical / Trunk Assessment: Normal  Communication    Communication: No difficulties (Simultaneous filing. User may not have seen previous data.)  Cognition Arousal/Alertness: Awake/alert (Simultaneous filing. User may not have seen previous data.) Behavior During Therapy: [facility] for tasks assessed/performed (Simultaneous filing. User may not have seen previous data.) Overall Cognitive Status: Within Functional Limits for tasks assessed (Simultaneous filing. User may not have seen previous data.)                                          General Comments General comments (skin integrity, edema, etc.): SaO2 on RA at rest 96%O2, HR 101 bpm, with ambulation SaO2 dropped to 87%O2, HR 123bpm, ambulation with 2L supplemental O2 97%O2, HR 115        Assessment/Plan    PT Assessment Patient does not need any further PT services         PT Goals (Current goals can be found in the Care Plan section)  Acute Rehab PT Goals Patient Stated Goal: breathe easier PT Goal Formulation: With patient     AM-PAC PT "6 Clicks" Mobility  Outcome Measure Help needed turning from your back to your side while in a flat bed without using bedrails?: None Help needed moving from lying on your back to sitting on the side of a flat bed without using bedrails?: None Help needed moving to and from a bed to a chair (including a wheelchair)?: None Help needed standing up from a chair using your arms (e.g., wheelchair or bedside chair)?: None Help needed to walk in hospital room?: None Help needed climbing 3-5 steps with a railing? : None 6 Click Score: 24    End of Session Equipment Utilized During Treatment: Oxygen Activity Tolerance: Patient tolerated treatment well Patient left: in chair Nurse Communication: Mobility status;Other (comment) (supplemental O2 need) PT Visit Diagnosis: Difficulty in walking, not elsewhere classified (R26.2)    Time: 8366-2947 PT Time Calculation (min) (ACUTE ONLY): 21 min   Charges:   PT Evaluation $PT Eval Low  Complexity: 1 Low          Garner Dullea B. Beverely Risen PT, DPT Acute Rehabilitation Services Pager [phone] Office [phone]   Elon Alas [facility] 08/15/2021, 9:37 AM

## 2021-08-15 NOTE — Plan of Care (Signed)

## 2021-08-17 ENCOUNTER — Encounter (HOSPITAL_COMMUNITY): Payer: Self-pay

## 2021-08-19 ENCOUNTER — Other Ambulatory Visit (HOSPITAL_COMMUNITY): Payer: Self-pay

## 2021-08-19 LAB — CULTURE, BLOOD (ROUTINE X 2)
Culture: NO GROWTH
Culture: NO GROWTH
Special Requests: ADEQUATE
Special Requests: ADEQUATE

## 2021-08-20 ENCOUNTER — Emergency Department (HOSPITAL_COMMUNITY)
Admission: EM | Admit: 2021-08-20 | Discharge: 2021-08-20 | Disposition: A | Discharge status: Home / Self Care | Payer: Medicare HMO | Attending: Emergency Medicine | Admitting: Emergency Medicine

## 2021-08-20 ENCOUNTER — Encounter (HOSPITAL_COMMUNITY): Payer: Self-pay | Admitting: Emergency Medicine

## 2021-08-20 ENCOUNTER — Other Ambulatory Visit: Payer: Self-pay

## 2021-08-20 ENCOUNTER — Emergency Department (HOSPITAL_COMMUNITY): Payer: Medicare HMO

## 2021-08-20 DIAGNOSIS — R0789 Other chest pain: Secondary | ICD-10-CM

## 2021-08-20 DIAGNOSIS — I10 Essential (primary) hypertension: Secondary | ICD-10-CM | POA: Insufficient documentation

## 2021-08-20 DIAGNOSIS — J449 Chronic obstructive pulmonary disease, unspecified: Secondary | ICD-10-CM | POA: Diagnosis not present

## 2021-08-20 DIAGNOSIS — R0602 Shortness of breath: Secondary | ICD-10-CM | POA: Diagnosis present

## 2021-08-20 HISTORY — DX: Bronchitis, not specified as acute or chronic: J40

## 2021-08-20 HISTORY — DX: Essential (primary) hypertension: I10

## 2021-08-20 LAB — BASIC METABOLIC PANEL
Anion gap: 6 (ref 5–15)
BUN: 20 mg/dL (ref 8–23)
CO2: 31 mmol/L (ref 22–32)
Calcium: 9.8 mg/dL (ref 8.9–10.3)
Chloride: 102 mmol/L (ref 98–111)
Creatinine, Ser: 0.87 mg/dL (ref 0.61–1.24)
GFR, Estimated: >60 mL/min (ref >60)
Glucose, Bld: 92 mg/dL (ref 70–99)
Potassium: 3.4 mmol/L — ABNORMAL LOW (ref 3.5–5.1)
Sodium: 139 mmol/L (ref 135–145)

## 2021-08-20 LAB — CBC
HCT: 46 % (ref 39.0–52.0)
Hemoglobin: 14.9 g/dL (ref 13.0–17.0)
MCH: 29.4 pg (ref 26.0–34.0)
MCHC: 32.4 g/dL (ref 30.0–36.0)
MCV: 90.7 fL (ref 80.0–100.0)
Platelets: 299 10*3/uL (ref 150–400)
RBC: 5.07 MIL/uL (ref 4.22–5.81)
RDW: 14.5 % (ref 11.5–15.5)
WBC: 6.7 10*3/uL (ref 4.0–10.5)
nRBC: 0 % (ref 0.0–0.2)

## 2021-08-20 LAB — BRAIN NATRIURETIC PEPTIDE: B Natriuretic Peptide: 20.5 pg/mL (ref 0.0–100.0)

## 2021-08-20 LAB — TROPONIN I (HIGH SENSITIVITY)
Troponin I (High Sensitivity): 10 ng/L (ref <18)
Troponin I (High Sensitivity): 6 ng/L (ref <18)

## 2021-08-20 NOTE — Discharge Instructions (Addendum)
As we discussed there is no evidence of damage to your heart, new pneumonia, or other lung damage. You did not show any need for oxygen requirements today either at rest or while moving.  Please follow up with your pulmonology doctor on Monday as discussed. Please return if your symptoms worsen or fail to improve.

## 2021-08-20 NOTE — ED Notes (Signed)
ED Provider at bedside. 

## 2021-08-20 NOTE — ED Triage Notes (Signed)
Pt states he had oxygen at home and the company came and picked up the oxygen tank yesterday and didn't replace it.  Reports L sided chest tightness and SOB since last night.

## 2021-08-20 NOTE — Care Management (Addendum)
Called [employer], he was set up with oxygen upon discharge on 11/14 from [facility]. [employer] states he called them on 11/16 and states he did not want the oxygen , he cannot take it to work , and would follow up with his PCP. He asked them to pick it up.  If he is now wanting oxygen back, new qualifications must be done to verify it is still needed. Messaged RN and MD . [phone] Provider states he walker patient and on room air the saturations never fell below 93%, was 100% on RA at rest. This was discussed between provider and patient. Per provider no oxygen warranted at this time.

## 2021-08-20 NOTE — ED Provider Notes (Signed)
[facility] EMERGENCY DEPARTMENT Provider Note   CSN: [id number] Arrival date & time: 08/20/21  1106     History Chief Complaint  Patient presents with   Chest Pain    Bryan Young is a 70 y.o. male with past medical history significant for COPD, hypertension, bronchitis who presents with left-sided chest tightness without pain, as well as a feeling of "heaviness right side" or feeling like he cannot quite catch his breath.  Patient also endorses some congestion in his nose.  Patient recently started on oxygen for COPD, reports that his oxygen was not refilled at home, so he does not have a pulse.  Patient is using his other COPD medications at this time.  Patient denies overt chest pain, denies nausea, vomiting, radiation to head, neck, arm.  Patient denies back pain.  Additionally patient reports that he has not had his oxygen refilled, some confusion with delivery person, also confusion about when he needs to use oxygen.  Patient reports primarily he is feeling shortness of breath in the morning, associated with some nasal congestion, denies shortness of breath on exertion.   Chest Pain Associated symptoms: cough and shortness of breath       Past Medical History:  Diagnosis Date   Bronchitis    Hypertension     Patient Active Problem List   Diagnosis Date Noted   COPD exacerbation ([place]) 08/14/2021    History reviewed. No pertinent surgical history.     No family history on file.  Social History   Tobacco Use   Smoking status: Never   Smokeless tobacco: Never  Substance Use Topics   Alcohol use: Not Currently   Drug use: Not Currently    Home Medications Prior to Admission medications   Medication Sig Start Date End Date Taking? Authorizing Provider  albuterol (VENTOLIN HFA) 108 (90 Base) MCG/ACT inhaler Inhale 2 puffs into the lungs every 6 (six) hours as needed for wheezing or shortness of breath. 08/15/21  Yes Lacinda Axon, MD   doxycycline (VIBRA-TABS) 100 MG tablet Take 1 tablet (100 mg total) by mouth every 12 hours for 4 days. 08/15/21 08/23/21 Yes Lacinda Axon, MD  predniSONE (DELTASONE) 20 MG tablet Take 20 mg by mouth daily with breakfast. 08/16/21  Yes [provider]  Pseudoephedrine-APAP-DM (DAYQUIL PO) Take 1 Dose by mouth 2 (two) times daily as needed (cold symptoms).   Yes [provider]  Tiotropium Bromide Monohydrate (SPIRIVA RESPIMAT) 2.5 MCG/ACT AERS Inhale 2 puffs into the lungs daily. 08/15/21  Yes Lacinda Axon, MD    Allergies    Patient has no known allergies.  Review of Systems   Review of Systems  Respiratory:  Positive for cough, chest tightness and shortness of breath.   All other systems reviewed and are negative.  Physical Exam Updated Vital Signs BP (!) 139/96   Pulse 65   Temp 98.5 F (36.9 C) (Oral)   Resp (!) 22   SpO2 98%   Physical Exam Vitals and nursing note reviewed.  Constitutional:      General: He is not in acute distress.    Appearance: Normal appearance.     Comments: Overall well-appearing male in no acute distress  HENT:     Head: Normocephalic and atraumatic.  Eyes:     General:        Right eye: No discharge.        Left eye: No discharge.  Cardiovascular:     Rate  and Rhythm: Normal rate and regular rhythm.     Heart sounds: No murmur heard.   No friction rub. No gallop.  Pulmonary:     Effort: Pulmonary effort is normal.     Breath sounds: Normal breath sounds.     Comments: Lungs clear to auscultation bilaterally, patient is intermittently with minimal tachypnea on exertion, without oxygen saturation, speaking in full sentences while walking, no wheezes, rhonchi, rales heard on physical exam. Abdominal:     General: Bowel sounds are normal.     Palpations: Abdomen is soft.  Skin:    General: Skin is warm and dry.     Capillary Refill: Capillary refill takes less than 2 seconds.  Neurological:     Mental  Status: He is alert and oriented to person, place, and time.  Psychiatric:        Mood and Affect: Mood normal.        Behavior: Behavior normal.    ED Results / Procedures / Treatments   Labs (all labs ordered are listed, but only abnormal results are displayed) Labs Reviewed  BASIC METABOLIC PANEL - Abnormal; Notable for the following components:      Result Value   Potassium 3.4 (*)    All other components within normal limits  CBC  BRAIN NATRIURETIC PEPTIDE  TROPONIN I (HIGH SENSITIVITY)  TROPONIN I (HIGH SENSITIVITY)    EKG EKG Interpretation  Date/Time:  Saturday August 20 2021 11:14:48 EST Ventricular Rate:  72 PR Interval:  172 QRS Duration: 96 QT Interval:  388 QTC Calculation: 424 R Axis:   86 Text Interpretation: Normal sinus rhythm Minimal voltage criteria for LVH, may be normal variant ( Sokolow-Lyon ) Nonspecific T wave abnormality Abnormal ECG When compared to prior, similar appearance. No STEMI Confirmed by Antony Blackbird [id number]) on 08/20/2021 2:21:06 PM  Radiology DG Chest 2 View  Result Date: 08/20/2021 CLINICAL DATA:  Chest tightness EXAM: CHEST - 2 VIEW COMPARISON:  None. FINDINGS: The heart size and mediastinal contours are within normal limits. Both lungs are clear. The visualized skeletal structures are unremarkable. IMPRESSION: No active cardiopulmonary disease. Electronically Signed   By: Davina Poke D.O.   On: 08/20/2021 12:05    Procedures Procedures   Medications Ordered in ED Medications - No data to display  ED Course  I have reviewed the triage vital signs and the nursing notes.  Pertinent labs & imaging results that were available during my care of the patient were reviewed by me and considered in my medical decision making (see chart for details).    MDM Rules/Calculators/A&P                         Given the large differential diagnosis for ILAI WARK, the decision making in this case is of high complexity.  After  evaluating all of the data points in this case, the presentation of [name] is NOT consistent with Acute Coronary Syndrome (ACS) and/or myocardial ischemia, pulmonary embolism, aortic dissection; Borhaave's, significant arrythmia, pneumothorax, cardiac tamponade, or other emergent cardiopulmonary condition.  Further, the presentation of ZONG SORRENTO is NOT consistent with pericarditis, myocarditis, cholecystitis, pancreatitis, mediastinitis, endocarditis, new valvular disease.  Additionally, the presentation of HAZLE ROSENDAHL is NOT consistent with flail chest, cardiac contusion, ARDS, or significant intra-thoracic or intra-abdominal bleeding.  Moreover, this presentation is NOT consistent with pneumonia, sepsis, or pyelonephritis.  The patient has a known COPD diagnosis with some AM nasal congestion.  Ambulated without oxygen saturation below 93%. Benign CXR, troponin negative x 1 with greater than 3 hours of chest pain, negative BNP, non-ischemic EKG which appears similar to prior. Does not meet criteria for oxygen requirement at this time. Heart score of 4 at this time, with reassuring workup, history that is not consistent with ischemia. Discussed with patient and plan for discharge at this time. Patient has close follow up for COPD on Monday for re-evaluation of need for COPD meds.  Strict return and follow-up precautions have been given by me personally or by detailed written instruction given verbally by nursing staff using the teach back method to the patient/family/caregiver(s).  Data Reviewed/Counseling: I have reviewed the patient's vital signs, nursing notes, and other relevant tests/information. I had a detailed discussion regarding the historical points, exam findings, and any diagnostic results supporting the discharge diagnosis. I also discussed the need for outpatient follow-up and the need to return to the ED if symptoms worsen or if there are any questions or concerns that  arise at home.  Final Clinical Impression(s) / ED Diagnoses Final diagnoses:  Chest tightness  Chronic obstructive pulmonary disease, unspecified COPD type [facility])    Rx / DC Orders ED Discharge Orders     None        Olene Floss, PA-C 08/20/21 1543    Tegeler, Canary Brim, MD 08/20/21 1550

## 2021-08-20 NOTE — ED Provider Notes (Signed)
Emergency Medicine Provider Triage Evaluation Note  Bryan Young , a 70 y.o. male  was evaluated in triage.  Patient presents with chest tightness that started last night.  On the left side.  Rates it 6 out of 10 in intensity.  Says that its feels dull.  Does not radiate anywhere.  Currently he has also had some associated shortness of breath last night.  He was recently diagnosed with COPD and was post to have oxygen at home.  Apparently the O2 tank was picked it up and it was not replaced.  Patient denies any nausea, vomiting, weakness or numbness to extremities, headaches, vision changes, abdominal pain.  Review of Systems  Positive: Chest tightness, shortness of breath Negative: above  Physical Exam  There were no vitals taken for this visit. Gen:   Awake, no distress   Resp:  Normal effort  MSK:   Moves extremities without difficulty  Other:    Medical Decision Making  Medically screening exam initiated at 11:16 AM.  Appropriate orders placed.  ELRAY DAINS was informed that the remainder of the evaluation will be completed by another provider, this initial triage assessment does not replace that evaluation, and the importance of remaining in the ED until their evaluation is complete.     Claudie Leach, PA-C 08/20/21 1118    Tegeler, Canary Brim, MD 08/20/21 1550

## 2021-08-22 ENCOUNTER — Ambulatory Visit (INDEPENDENT_AMBULATORY_CARE_PROVIDER_SITE_OTHER): Payer: Medicare HMO | Admitting: Internal Medicine

## 2021-08-22 ENCOUNTER — Other Ambulatory Visit (HOSPITAL_COMMUNITY): Payer: Self-pay

## 2021-08-22 ENCOUNTER — Other Ambulatory Visit: Payer: Self-pay

## 2021-08-22 VITALS — BP 151/100 | HR 92 | Temp 98.0°F | Ht 71.0 in | Wt 163.7 lb

## 2021-08-22 DIAGNOSIS — J441 Chronic obstructive pulmonary disease with (acute) exacerbation: Secondary | ICD-10-CM | POA: Diagnosis not present

## 2021-08-22 DIAGNOSIS — E785 Hyperlipidemia, unspecified: Secondary | ICD-10-CM

## 2021-08-22 DIAGNOSIS — J449 Chronic obstructive pulmonary disease, unspecified: Secondary | ICD-10-CM

## 2021-08-22 DIAGNOSIS — R059 Cough, unspecified: Secondary | ICD-10-CM

## 2021-08-22 DIAGNOSIS — Z716 Tobacco abuse counseling: Secondary | ICD-10-CM

## 2021-08-22 DIAGNOSIS — I10 Essential (primary) hypertension: Secondary | ICD-10-CM | POA: Diagnosis not present

## 2021-08-22 DIAGNOSIS — Z0000 Encounter for general adult medical examination without abnormal findings: Secondary | ICD-10-CM

## 2021-08-22 MED ORDER — ATORVASTATIN CALCIUM 40 MG PO TABS: 40.0000 mg | ORAL_TABLET | Freq: Every day | ORAL | 2 refills | Status: AC

## 2021-08-22 MED ORDER — GUAIFENESIN 100 MG/5ML PO LIQD: 5.0000 mL | ORAL | 0 refills | Status: DC | PRN

## 2021-08-22 NOTE — Patient Instructions (Signed)
Bryan Young, it was a pleasure seeing you today!  Today we discussed: COPD Please continue using your inhalers as you have been.  I am working on getting you something called a spacer to help you take you albuterol.  I have ordered a test called a pulmonary function test. This will help [place] understand what is going on with your lungs.  Smoking Please try to use nicotine patches to help with decreasing your craving to smoke.  Follow-up in four weeks and we can touch base and see if you would benefit from trying Chantix. I would like you to have a CT of your chest to screen for cancer with how long you have been smoking.  Blood pressure Your blood pressure was elevated today. [facility] to check [facility] over the next four weeks and we will talk about this again at the next visit.  Cholesterol Your cholesterol was high when it was checked in the hospital.  It is important to control cholesterol to decrease risk of stroke and heart attack. I am sending in a medication called atorvastatin 40 mg to decrease cholesterol. Take this once daily  I have ordered the following labs today:  Lab Orders  No laboratory test(s) ordered today     Tests ordered today:  Pulmonary Function test  Referrals ordered today:   Referral Orders  No referral(s) requested today     I have ordered the following medication/changed the following medications:   Stop the following medications: There are no discontinued medications.   Start the following medications: No orders of the defined types were placed in this encounter.    Follow-up:  4 weeks    Please make sure to arrive 15 minutes prior to your next appointment. If you arrive late, you may be asked to reschedule.   We look forward to seeing you next time. Please call our clinic at [phone] if you have any questions or concerns. The best time to call is Monday-Friday from 9am-4pm, but there is someone available 24/7. If after  hours or the weekend, call the main hospital number and ask for the Internal Medicine Resident On-Call. If you need medication refills, please notify your pharmacy one week in advance and they will send [place] a request.  Thank you for letting us take part in your care. Wishing you the best!  Thank you, Dr. [facility]

## 2021-08-22 NOTE — Progress Notes (Signed)
Subjective:  CC: Hospital follow-up for COPD exacerbation and to establish care   HPI:  [name] is a 70 y.o. male with a past medical history stated below and presents today for hospital follow-up for COPD exacerbation and to establish care . Please see problem based assessment and plan for additional details.  Past Medical History:  Diagnosis Date   Bronchitis    Hypertension     Current Outpatient Medications on File Prior to Visit  Medication Sig Dispense Refill   albuterol (VENTOLIN HFA) 108 (90 Base) MCG/ACT inhaler Inhale 2 puffs into the lungs every 6 (six) hours as needed for wheezing or shortness of breath. 8.5 g 2   Tiotropium Bromide Monohydrate (SPIRIVA RESPIMAT) 2.5 MCG/ACT AERS Inhale 2 puffs into the lungs daily. 4 g 0   No current facility-administered medications on file prior to visit.    No family history on file.  Social History   Socioeconomic History   Marital status: Single    Spouse name: Not on file   Number of children: Not on file   Years of education: Not on file   Highest education level: Not on file  Occupational History   Not on file  Tobacco Use   Smoking status: Every Day    Packs/day: 0.50    Years: 56.00    Pack years: 28.00    Types: Cigarettes    Start date: 1966   Smokeless tobacco: Never  Substance and Sexual Activity   Alcohol use: Not Currently   Drug use: Not Currently   Sexual activity: Not on file  Other Topics Concern   Not on file  Social History Narrative   ** Merged History Encounter **       Social Determinants of Health   Financial Resource Strain: Not on file  Food Insecurity: Not on file  Transportation Needs: Not on file  Physical Activity: Not on file  Stress: Not on file  Social Connections: Not on file  Intimate Partner Violence: Not on file   Social history: He lives alone in a boarding house in [place]. He has several friends and family who live in the area.  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  Review of Systems: ROS negative except for what is noted on the assessment and plan.  Objective:   Vitals:   08/22/21 1542 08/22/21 1550  BP: (!) 148/118 (!) 151/100  Pulse: 92   Temp: 98 F (36.7 C)   TempSrc: Oral   SpO2: 100%   Weight: 163 lb 11.2 oz (74.3 kg)   Height: 5\' 11"  (1.803 m)     Physical Exam: Gen: A&O x3 and in no apparent distress, well appearing and nourished. HEENT:    Head - normocephalic, atraumatic.    Eye - visual acuity grossly intact, conjunctiva clear, sclera non-icteric, EOM intact.    Mouth - No obvious caries or periodontal disease. Neck: no masses or nodules, AROM intact. CV: RRR, no murmurs, S1/S2 presents  Resp: Clear to auscultation bilaterally, normal pulmonary effort Abd: BS (+) x4, soft, non-tender abdomen, without hepatosplenomegaly or masses MSK: Grossly normal AROM and strength x4 extremities. Skin: good skin turgor, no rashes, unusual bruising, or prominent lesions.  Neuro: No focal deficits, grossly normal sensation and coordination.  Psych: Oriented x3 and responding appropriately. Intact memory, normal mood, judgement, affect, and insight.    Assessment & Plan:  See Encounters Tab for problem based charting.  Patient seen with Dr.  Rudene Christians, D.O. [facility]  PGY-1 Pager: [phone]  Phone: [phone] Date 08/23/2021  Time 9:10 AM

## 2021-08-23 ENCOUNTER — Encounter: Payer: Self-pay | Admitting: Internal Medicine

## 2021-08-23 DIAGNOSIS — Z0000 Encounter for general adult medical examination without abnormal findings: Secondary | ICD-10-CM | POA: Insufficient documentation

## 2021-08-23 DIAGNOSIS — E785 Hyperlipidemia, unspecified: Secondary | ICD-10-CM | POA: Insufficient documentation

## 2021-08-23 DIAGNOSIS — I10 Essential (primary) hypertension: Secondary | ICD-10-CM | POA: Insufficient documentation

## 2021-08-23 DIAGNOSIS — Z716 Tobacco abuse counseling: Secondary | ICD-10-CM | POA: Insufficient documentation

## 2021-08-23 MED ORDER — NICOTINE 7 MG/24HR TD PT24: 7.0000 mg | MEDICATED_PATCH | Freq: Every day | TRANSDERMAL | 0 refills | Status: DC

## 2021-08-23 NOTE — Assessment & Plan Note (Signed)
Patient presents for hospital follow-up with elevated blood pressure today.  BP Readings from Last 3 Encounters:  08/22/21 (!) 151/100  08/20/21 (!) 139/96  08/15/21 (!) 134/92   Blood pressure while in hospital were normotensive.  Patient does not have a way to check his blood pressure at home.  [facility] four weeks and record blood pressure and then bring log in at next office visit.  Plan: -review BP log -If BP consistently elevated consider addition of BP medications

## 2021-08-23 NOTE — Assessment & Plan Note (Addendum)
Patient states that he started smoking at 14 and currently smokes about 1.5 packs per day.  He reports that the nicotine patches in the hospital helped reduced craving for cigarettes.  He has not previously tried to reduce amount he smokes, but would like to with his new lung diagnosis.    Plan: -Start using nicotine patch to help reduce craving -Consider starting chantix at next office visit. Patient is interested in any therapy that would help him stop smoking, but he seems overwhelmed with other medication changes and testing ordered today so will hold off on starting chantix for now. -CT chest Low-dose for cancer screening -Follow-up in four weeks.

## 2021-08-23 NOTE — Assessment & Plan Note (Addendum)
Patient presents for hospital follow-up for COPD exacerbation from 11/13-11/14.  No prior diagnosis of COPD and he had not been seen by a physician in several years. He presented following a week of shortness of breath and increased cough with sputum production. CXR showed hyperexpansion of lungs with probable emphysematous changes.  He was treated with 5 day course of prednisone 40 mg and doxycycline 100 mg.  Mr. Vandyne reports finishing course of both medications.  He went back to ED on 11/19 due to chest tightness and difficulty catching breath.  He was also concerned because he was unsure when he should be using supplemental oxygen. Ambulatory O2 saturation in ED >93%.  Patient states that he has been using both albuterol  and Tiotropium inhalers.  He is concerned that albuterol medication is not getting into his lungs because he tastes the medication after using inhaler and has not felt symptom relief. Ambulatory oxygen saturations at 96% with pulse of 75.  He reports he feels better than when he went to the hospital on 11/13 and is glad he now knows what is going on. He states that he was given cough medication in the hospital and found this to be helpful.  Plan: -Continue Tiotropium inhaler 2 puffs daily -Continue Albuterol inhaler PRN -Patient was given spacer to use with albuterol inhaler -Ambulatory saturation >90%, patient does not need supplemental oxygen. -PFT to determine staging of COPD -Guaifenesin every four hours prn

## 2021-08-23 NOTE — Progress Notes (Signed)
Internal Medicine Clinic Attending  I saw and evaluated the patient.  I personally confirmed the key portions of the history and exam documented by Dr. Masters and I reviewed pertinent patient test results.  The assessment, diagnosis, and plan were formulated together and I agree with the documentation in the resident's note.  

## 2021-08-23 NOTE — Assessment & Plan Note (Signed)
Patient presents for hospital follow-up. Lipid panel completed in hospital. Lab Results  Component Value Date   CHOL 210 (H) 08/14/2021   HDL 68 08/14/2021   LDLCALC 135 (H) 08/14/2021   TRIG 33 08/14/2021   CHOLHDL 3.1 08/14/2021  ASCVD risk of 24.4%  Plan: -Start high intensity statin atorvastatin 40 mg qd

## 2021-08-31 ENCOUNTER — Other Ambulatory Visit: Payer: Self-pay

## 2021-08-31 ENCOUNTER — Other Ambulatory Visit (HOSPITAL_COMMUNITY): Payer: Self-pay

## 2021-08-31 MED ORDER — SPIRIVA RESPIMAT 2.5 MCG/ACT IN AERS
2.0000 | INHALATION_SPRAY | Freq: Every day | RESPIRATORY_TRACT | 2 refills | Status: DC
  Filled 2021-08-31: qty 4, 30d supply, fill #0

## 2021-08-31 NOTE — Telephone Encounter (Signed)
RTC, busy signal obtained, will try again later SChaplin, RN,BSN

## 2021-08-31 NOTE — Telephone Encounter (Signed)
Please call pt back about Tiotropium Bromide Monohydrate (SPIRIVA RESPIMAT) 2.5 MCG/ACT AERS.

## 2021-08-31 NOTE — Telephone Encounter (Signed)
Received TC from [facility] at [facility], she states patient is at the pharmacy looking for his albuterol and Spiriva. Pt should have Albuterol refills at [facility] and Alan Ripper was able to transfer those.  He does not have any Spiriva refills. Alan Ripper states he does have his spacer and she will make sure he is using his inhalers correctly.  Will forward to PCP, as well as red team since patient is at pharmacy and needing Spiriva.  NOV 12/19 Thank you! SChaplin, RN,BSN

## 2021-09-05 ENCOUNTER — Other Ambulatory Visit (HOSPITAL_COMMUNITY): Payer: Self-pay

## 2021-09-07 ENCOUNTER — Other Ambulatory Visit (HOSPITAL_COMMUNITY): Payer: Self-pay

## 2021-09-09 ENCOUNTER — Other Ambulatory Visit (HOSPITAL_COMMUNITY): Payer: Self-pay

## 2021-09-14 DIAGNOSIS — J441 Chronic obstructive pulmonary disease with (acute) exacerbation: Secondary | ICD-10-CM | POA: Diagnosis not present

## 2021-09-19 ENCOUNTER — Encounter: Payer: Self-pay | Admitting: Student

## 2021-09-19 ENCOUNTER — Ambulatory Visit (INDEPENDENT_AMBULATORY_CARE_PROVIDER_SITE_OTHER): Payer: Medicare HMO | Admitting: Student

## 2021-09-19 ENCOUNTER — Other Ambulatory Visit (HOSPITAL_COMMUNITY): Payer: Self-pay

## 2021-09-19 VITALS — BP 132/77 | HR 73 | Temp 97.6°F | Ht 71.0 in | Wt 160.5 lb

## 2021-09-19 DIAGNOSIS — J441 Chronic obstructive pulmonary disease with (acute) exacerbation: Secondary | ICD-10-CM

## 2021-09-19 DIAGNOSIS — Z1211 Encounter for screening for malignant neoplasm of colon: Secondary | ICD-10-CM

## 2021-09-19 DIAGNOSIS — Z0000 Encounter for general adult medical examination without abnormal findings: Secondary | ICD-10-CM

## 2021-09-19 DIAGNOSIS — Z23 Encounter for immunization: Secondary | ICD-10-CM | POA: Diagnosis not present

## 2021-09-19 DIAGNOSIS — Z716 Tobacco abuse counseling: Secondary | ICD-10-CM

## 2021-09-19 MED ORDER — SPIRIVA RESPIMAT 2.5 MCG/ACT IN AERS
2.0000 | INHALATION_SPRAY | Freq: Every day | RESPIRATORY_TRACT | 2 refills | Status: AC
  Filled 2021-09-19 – 2021-10-21 (×2): qty 4, 30d supply, fill #0

## 2021-09-19 NOTE — Patient Instructions (Signed)
Bryan Young,  It was a pleasure seeing you in the clinic today. Here is a summary of what we talked about:  COPD: I resent a prescription of your Spiriva inhaler to the [facility] outpatient pharmacy.  Please call us if you cannot afford this inhaler.  Continue albuterol as needed. Smoking: I am glad that you are quitting smoking.  Please call the helpline number for free nicotine patches.  Please return in 3 months  Take care,  Dr. Cyndie Chime

## 2021-09-19 NOTE — Progress Notes (Signed)
° °  CC: COPD follow up  HPI:  [name] is a 70 y.o. with past medical history of hypertension, COPD who presents to clinic today to follow-up on his COPD.  Please see problem based charting for detailed  Past Medical History:  Diagnosis Date   Bronchitis    Hypertension    Review of Systems:  per HPI  Physical Exam:  Vitals:   09/19/21 1424  BP: 132/77  Pulse: 73  Temp: 97.6 F (36.4 C)  TempSrc: Oral  SpO2: 100%  Weight: 160 lb 8 oz (72.8 kg)  Height: 5\' 11"  (1.803 m)   Physical Exam Constitutional:      General: He is not in acute distress.    Appearance: He is not ill-appearing.  HENT:     Head: Normocephalic.  Eyes:     General:        Right eye: No discharge.        Left eye: No discharge.  Cardiovascular:     Rate and Rhythm: Normal rate and regular rhythm.     Heart sounds: Normal heart sounds.  Pulmonary:     Effort: Pulmonary effort is normal. No respiratory distress.     Breath sounds: Normal breath sounds. No wheezing or rales.  Musculoskeletal:        General: Normal range of motion.  Skin:    General: Skin is warm.  Neurological:     General: No focal deficit present.     Mental Status: He is alert and oriented to person, place, and time.  Psychiatric:        Mood and Affect: Mood normal.        Behavior: Behavior normal.     Assessment & Plan:   See Encounters Tab for problem based charting.  Patient discussed with Dr. 

## 2021-09-19 NOTE — Assessment & Plan Note (Signed)
-   Fit test for colon cancer screening - Flu shot and Tdap today

## 2021-09-19 NOTE — Assessment & Plan Note (Signed)
Patient with heavy smoking history.  Used to smoke 1-1/2 pack a day since he was 14.  Said that he has quit 1 week ago after hearing that smoking can affect his lungs.  Could not afford the nicotine patch due to cost.  Said that his craving is coming back.  -Resources given to patient for tobacco cessation.  He can call the helpline number to receive free nicotine patch.

## 2021-09-19 NOTE — Assessment & Plan Note (Addendum)
Patient with history of COPD (not confirmed by PFT).  He was admitted to the hospital in November for COPD exacerbation and was started on Spiriva Respimat and albuterol.  He has ran out of Spiriva 2 weeks ago.  Said that he could not afford the co-pay at that time.  He has been using his albuterol inhaler 5-6 times a day.  He denies wheezing but endorses coughing, which makes him short of breath.  His respiratory status is normal today.  Denies any shortness of breath and wheezing.  Lung sounds clear on physical exam.  No wheezing auscultated.  I asked patient if he wanted to retry if Spiriva is covered by his insurance or switch to a different inhaler.  He would like me to send it to the pharmacy again because he likes the mist inhaler.  -Refill Spiriva Respimat sent to [facility] outpatient pharmacy.  I asked him to call us if he cannot afford this medication and we can try another inhaler -Continue albuterol as needed -Pending PFT -Pending CT chest low-dose

## 2021-09-20 NOTE — Progress Notes (Signed)
Internal Medicine Clinic Attending  Case discussed with Dr. Nguyen  At the time of the visit.  We reviewed the resident's history and exam and pertinent patient test results.  I agree with the assessment, diagnosis, and plan of care documented in the resident's note. 

## 2021-09-23 ENCOUNTER — Other Ambulatory Visit (HOSPITAL_COMMUNITY): Payer: Self-pay

## 2021-09-27 ENCOUNTER — Other Ambulatory Visit (HOSPITAL_COMMUNITY): Payer: Self-pay

## 2021-09-29 ENCOUNTER — Ambulatory Visit (HOSPITAL_COMMUNITY): Payer: Medicare HMO | Attending: Internal Medicine

## 2021-10-15 DIAGNOSIS — J441 Chronic obstructive pulmonary disease with (acute) exacerbation: Secondary | ICD-10-CM | POA: Diagnosis not present

## 2021-10-21 ENCOUNTER — Other Ambulatory Visit (HOSPITAL_COMMUNITY): Payer: Self-pay

## 2021-11-15 DIAGNOSIS — J441 Chronic obstructive pulmonary disease with (acute) exacerbation: Secondary | ICD-10-CM | POA: Diagnosis not present

## 2021-12-13 DIAGNOSIS — J441 Chronic obstructive pulmonary disease with (acute) exacerbation: Secondary | ICD-10-CM | POA: Diagnosis not present

## 2022-01-03 DIAGNOSIS — Z125 Encounter for screening for malignant neoplasm of prostate: Secondary | ICD-10-CM | POA: Diagnosis not present

## 2022-01-03 DIAGNOSIS — Z789 Other specified health status: Secondary | ICD-10-CM | POA: Diagnosis not present

## 2022-01-03 DIAGNOSIS — F17211 Nicotine dependence, cigarettes, in remission: Secondary | ICD-10-CM | POA: Diagnosis not present

## 2022-01-03 DIAGNOSIS — Z0000 Encounter for general adult medical examination without abnormal findings: Secondary | ICD-10-CM | POA: Diagnosis not present

## 2022-01-03 DIAGNOSIS — J439 Emphysema, unspecified: Secondary | ICD-10-CM | POA: Diagnosis not present

## 2022-01-03 DIAGNOSIS — Z79899 Other long term (current) drug therapy: Secondary | ICD-10-CM | POA: Diagnosis not present

## 2022-01-13 DIAGNOSIS — J441 Chronic obstructive pulmonary disease with (acute) exacerbation: Secondary | ICD-10-CM | POA: Diagnosis not present

## 2022-01-24 DIAGNOSIS — J439 Emphysema, unspecified: Secondary | ICD-10-CM | POA: Diagnosis not present

## 2022-01-24 DIAGNOSIS — I739 Peripheral vascular disease, unspecified: Secondary | ICD-10-CM | POA: Diagnosis not present

## 2022-01-24 DIAGNOSIS — R011 Cardiac murmur, unspecified: Secondary | ICD-10-CM | POA: Diagnosis not present

## 2022-01-24 DIAGNOSIS — E785 Hyperlipidemia, unspecified: Secondary | ICD-10-CM | POA: Diagnosis not present

## 2022-01-24 DIAGNOSIS — Z789 Other specified health status: Secondary | ICD-10-CM | POA: Diagnosis not present

## 2022-01-24 DIAGNOSIS — Z0001 Encounter for general adult medical examination with abnormal findings: Secondary | ICD-10-CM | POA: Diagnosis not present

## 2022-01-24 DIAGNOSIS — Z23 Encounter for immunization: Secondary | ICD-10-CM | POA: Diagnosis not present

## 2022-01-24 DIAGNOSIS — Z79899 Other long term (current) drug therapy: Secondary | ICD-10-CM | POA: Diagnosis not present

## 2022-01-24 DIAGNOSIS — F17210 Nicotine dependence, cigarettes, uncomplicated: Secondary | ICD-10-CM | POA: Diagnosis not present

## 2022-01-24 DIAGNOSIS — Z1211 Encounter for screening for malignant neoplasm of colon: Secondary | ICD-10-CM | POA: Diagnosis not present

## 2022-02-28 ENCOUNTER — Other Ambulatory Visit: Payer: Self-pay | Admitting: Registered Nurse

## 2022-02-28 ENCOUNTER — Ambulatory Visit
Admission: RE | Admit: 2022-02-28 | Discharge: 2022-02-28 | Disposition: A | Discharge status: Home / Self Care | Payer: Medicare Other | Source: Ambulatory Visit | Attending: Registered Nurse | Admitting: Registered Nurse

## 2022-02-28 DIAGNOSIS — R079 Chest pain, unspecified: Secondary | ICD-10-CM

## 2022-03-08 ENCOUNTER — Telehealth: Payer: Self-pay

## 2022-03-08 NOTE — Telephone Encounter (Signed)
NOTES SCANNED TO REFERRAL 

## 2022-05-09 ENCOUNTER — Other Ambulatory Visit (HOSPITAL_COMMUNITY): Payer: Self-pay

## 2022-05-30 ENCOUNTER — Ambulatory Visit: Payer: Self-pay | Admitting: Surgery

## 2022-05-30 NOTE — H&P (Signed)
    Nadara Mode [id number]   Referring Provider:  Loura Back, NP   Subjective   Chief Complaint: Inguinal Hernia     History of Present Illness: Very pleasant 71 year old man with history of COPD/bronchitis, history of tobacco abuse (has quit smoking!!), and hypertension who presents with a complaint of a left inguinal hernia.  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  He is not sure how long it has been there.  No significant digestive issues or bladder issues although he does know he was supposed to be getting medication for his prostate but has not done so yet.  Denies any previous abdominal surgery.   Review of Systems: A complete review of systems was obtained from the patient.  I have reviewed this information and discussed as appropriate with the patient.  See HPI as well for other ROS.   Medical History: Past Medical History:  Diagnosis Date   Hyperlipidemia    Hypertension     There is no problem list on file for this patient.   History reviewed. No pertinent surgical history.   No Known Allergies  Current Outpatient Medications on File Prior to Visit  Medication Sig Dispense Refill   SPIRIVA WITH HANDIHALER 18 mcg inhalation capsule as directed     albuterol 90 mcg/actuation inhaler INHALE 2 PUFFS EVERY 6 HOURS AS NEEDED FOR COUGH/SHORTNESS OF BREATH     atorvastatin (LIPITOR) 40 MG tablet Take 40 mg by mouth once daily     No current facility-administered medications on file prior to visit.    History reviewed. No pertinent family history.   Social History   Tobacco Use  Smoking Status Former   Types: Cigarettes  Smokeless Tobacco Never     Social History   Socioeconomic History   Marital status: Unknown  Tobacco Use   Smoking status: Former    Types: Cigarettes   Smokeless tobacco: Never  Substance and Sexual  Activity   Alcohol use: Yes    Comment: Little   Drug use: Never    Objective:    Vitals:   05/30/22 1517  BP: 132/84  Pulse: 78  Temp: 36.8 C (98.2 F)  SpO2: 98%  Weight: 76.8 kg (169 lb 6.4 oz)  Height: 181.6 cm (5' 11.5")    Body mass index is 23.3 kg/m.  Alert, calm, cooperative Unlabored respirations Abdomen soft, nontender, nondistended Reducible large left inguinal hernia, nontender  Assessment and Plan:  Diagnoses and all orders for this visit:  Non-recurrent unilateral inguinal hernia without obstruction or gangrene    I recommend open repair with mesh. We discussed the relevant anatomy and we discussed the technique of the procedure.  Discussed risks of bleeding, infection, pain, scarring, injury to structures in the area including nerves, blood vessels, bowel, bladder, risk of chronic pain, hernia recurrence, risk of seroma or hematoma, urinary retention, and risks of general anesthesia including cardiovascular, pulmonary, and thromboembolic complications.  Questions were answered.  Patient wishes to proceed with scheduling.   Elizabethanne Lusher Carlye Grippe, MD

## 2022-06-19 NOTE — Patient Instructions (Signed)
DUE TO COVID-19 ONLY TWO VISITORS  (aged 71 and older)  ARE ALLOWED TO COME WITH YOU AND STAY IN THE WAITING ROOM ONLY DURING PRE OP AND PROCEDURE.   **NO VISITORS ARE ALLOWED IN THE SHORT STAY AREA OR RECOVERY ROOM!!**  IF YOU WILL BE ADMITTED INTO THE HOSPITAL YOU ARE ALLOWED ONLY FOUR SUPPORT PEOPLE DURING VISITATION HOURS ONLY (7 AM -8PM)   The support person(s) must pass our screening, gel in and out, and wear a mask at all times, including in the patient's room. Patients must also wear a mask when staff or their support person are in the room. Visitors GUEST BADGE MUST BE WORN VISIBLY  One adult visitor may remain with you overnight and MUST be in the room by 8 P.M.     Your procedure is scheduled on: 06/30/22   Report to [facility] Main Entrance    Report to admitting at : 10:15 AM   Call this number if you have problems the morning of surgery [phone]   Do not eat food :After Midnight.   After Midnight you may have the following liquids until : 9:30 AM DAY OF SURGERY  Water Black Coffee (sugar ok, NO MILK/CREAM OR CREAMERS)  Tea (sugar ok, NO MILK/CREAM OR CREAMERS) regular and decaf                             Plain Jell-O (NO RED)                                           Fruit ices (not with fruit pulp, NO RED)                                     Popsicles (NO RED)                                                                  Juice: apple, WHITE grape, WHITE cranberry Sports drinks like Gatorade (NO RED)             Oral Hygiene is also important to reduce your risk of infection.                                    Remember - BRUSH YOUR TEETH THE MORNING OF SURGERY WITH YOUR REGULAR TOOTHPASTE   Do NOT smoke after Midnight   Take these medicines the morning of surgery with A SIP OF WATER: N/A. Use inhalers as usual.  DO NOT TAKE ANY ORAL DIABETIC MEDICATIONS DAY OF YOUR SURGERY  Bring CPAP mask and tubing day of surgery.                               You may not have any metal on your body including hair pins, jewelry, and body piercing             Do not wear lotions,  powders, perfumes/cologne, or deodorant              Men may shave face and neck.   Do not bring valuables to the hospital. [facility] IS NOT             RESPONSIBLE   FOR VALUABLES.   Contacts, dentures or bridgework may not be worn into surgery.   Bring small overnight bag day of surgery.   DO NOT BRING YOUR HOME MEDICATIONS TO THE HOSPITAL. PHARMACY WILL DISPENSE MEDICATIONS LISTED ON YOUR MEDICATION LIST TO YOU DURING YOUR ADMISSION IN THE HOSPITAL!    Patients discharged on the day of surgery will not be allowed to drive home.  Someone NEEDS to stay with you for the first 24 hours after anesthesia.   Special Instructions: Bring a copy of your healthcare power of attorney and living will documents         the day of surgery if you haven't scanned them before.              Please read over the following fact sheets you were given: IF YOU HAVE QUESTIONS ABOUT YOUR PRE-OP INSTRUCTIONS PLEASE CALL [phone]     [facility] - Preparing for Surgery Before surgery, you can play an important role.  Because skin is not sterile, your skin needs to be as free of germs as possible.  You can reduce the number of germs on your skin by washing with CHG (chlorahexidine gluconate) soap before surgery.  CHG is an antiseptic cleaner which kills germs and bonds with the skin to continue killing germs even after washing. Please DO NOT use if you have an allergy to CHG or antibacterial soaps.  If your skin becomes reddened/irritated stop using the CHG and inform your nurse when you arrive at Short Stay. Do not shave (including legs and underarms) for at least 48 hours prior to the first CHG shower.  You may shave your face/neck. Please follow these instructions carefully:  1.  Shower with CHG Soap the night before surgery and the  morning of Surgery.  2.  If you choose to wash your  hair, wash your hair first as usual with your  normal  shampoo.  3.  After you shampoo, rinse your hair and body thoroughly to remove the  shampoo.                           4.  Use CHG as you would any other liquid soap.  You can apply chg directly  to the skin and wash                       Gently with a scrungie or clean washcloth.  5.  Apply the CHG Soap to your body ONLY FROM THE NECK DOWN.   Do not use on face/ open                           Wound or open sores. Avoid contact with eyes, ears mouth and genitals (private parts).                       Wash face,  Genitals (private parts) with your normal soap.             6.  Wash thoroughly, paying special attention to the area where your surgery  will be performed.  7.  Thoroughly rinse your body with warm water from the neck down.  8.  DO NOT shower/wash with your normal soap after using and rinsing off  the CHG Soap.                9.  Pat yourself dry with a clean towel.            10.  Wear clean pajamas.            11.  Place clean sheets on your bed the night of your first shower and do not  sleep with pets. Day of Surgery : Do not apply any lotions/deodorants the morning of surgery.  Please wear clean clothes to the hospital/surgery center.  FAILURE TO FOLLOW THESE INSTRUCTIONS MAY RESULT IN THE CANCELLATION OF YOUR SURGERY PATIENT SIGNATURE_________________________________  NURSE SIGNATURE__________________________________  ________________________________________________________________________

## 2022-06-20 ENCOUNTER — Encounter (HOSPITAL_COMMUNITY): Payer: Self-pay

## 2022-06-20 ENCOUNTER — Other Ambulatory Visit: Payer: Self-pay

## 2022-06-20 ENCOUNTER — Encounter (HOSPITAL_COMMUNITY)
Admission: RE | Admit: 2022-06-20 | Discharge: 2022-06-20 | Disposition: A | Discharge status: Home / Self Care | Payer: 59 | Source: Ambulatory Visit | Attending: Surgery | Admitting: Surgery

## 2022-06-20 VITALS — BP 125/79 | HR 62 | Temp 97.7°F | Resp 18 | Ht 71.5 in | Wt 170.0 lb

## 2022-06-20 DIAGNOSIS — I10 Essential (primary) hypertension: Secondary | ICD-10-CM

## 2022-06-20 DIAGNOSIS — Z01812 Encounter for preprocedural laboratory examination: Secondary | ICD-10-CM | POA: Diagnosis not present

## 2022-06-20 HISTORY — DX: Chronic obstructive pulmonary disease, unspecified: J44.9

## 2022-06-20 HISTORY — DX: Dyspnea, unspecified: R06.00

## 2022-06-20 HISTORY — DX: Unspecified osteoarthritis, unspecified site: M19.90

## 2022-06-20 LAB — CBC
HCT: 45.7 % (ref 39.0–52.0)
Hemoglobin: 14.8 g/dL (ref 13.0–17.0)
MCH: 29.5 pg (ref 26.0–34.0)
MCHC: 32.4 g/dL (ref 30.0–36.0)
MCV: 91.2 fL (ref 80.0–100.0)
Platelets: 295 10*3/uL (ref 150–400)
RBC: 5.01 MIL/uL (ref 4.22–5.81)
RDW: 15.5 % (ref 11.5–15.5)
WBC: 3.2 10*3/uL — ABNORMAL LOW (ref 4.0–10.5)
nRBC: 0 % (ref 0.0–0.2)

## 2022-06-20 LAB — BASIC METABOLIC PANEL
Anion gap: 5 (ref 5–15)
BUN: 17 mg/dL (ref 8–23)
CO2: 25 mmol/L (ref 22–32)
Calcium: 9.6 mg/dL (ref 8.9–10.3)
Chloride: 111 mmol/L (ref 98–111)
Creatinine, Ser: 1.01 mg/dL (ref 0.61–1.24)
GFR, Estimated: >60 mL/min (ref >60)
Glucose, Bld: 91 mg/dL (ref 70–99)
Potassium: 3.8 mmol/L (ref 3.5–5.1)
Sodium: 141 mmol/L (ref 135–145)

## 2022-06-20 NOTE — Progress Notes (Addendum)
For Short Stay: [place] appointment date: Date of COVID positive in last 12 days:  Bowel Prep reminder:   For Anesthesia: PCP - Dr. Rick Duff Cardiologist - N/A  Chest x-ray - 02/28/22 EKG - 08/22/21 Stress Test -  ECHO -  Cardiac Cath -  Pacemaker/ICD device last checked: Pacemaker orders received: Device Rep notified:  Spinal Cord Stimulator:  Sleep Study -  CPAP -   Fasting Blood Sugar -  Checks Blood Sugar _____ times a day Date and result of last Hgb A1c-  Blood Thinner Instructions: Aspirin Instructions: Last Dose:  Activity level: Can go up a flight of stairs and activities of daily living without stopping and without chest pain and/or shortness of breath   Able to exercise without chest pain and/or shortness of breath   Unable to go up a flight of stairs without chest pain and/or shortness of breath     Anesthesia review: Hx: HTN,Smoker,COPD  Patient denies shortness of breath, fever, cough and chest pain at PAT appointment   Patient verbalized understanding of instructions that were given to them at the PAT appointment. Patient was also instructed that they will need to review over the PAT instructions again at home before surgery.

## 2022-06-30 ENCOUNTER — Encounter (HOSPITAL_COMMUNITY): Payer: Self-pay | Admitting: Surgery

## 2022-06-30 ENCOUNTER — Encounter (HOSPITAL_COMMUNITY)
Admission: RE | Disposition: A | Discharge status: Home / Self Care | Payer: Self-pay | Source: Ambulatory Visit | Attending: Surgery

## 2022-06-30 ENCOUNTER — Other Ambulatory Visit: Payer: Self-pay

## 2022-06-30 ENCOUNTER — Ambulatory Visit (HOSPITAL_BASED_OUTPATIENT_CLINIC_OR_DEPARTMENT_OTHER): Payer: 59 | Admitting: Certified Registered Nurse Anesthetist

## 2022-06-30 ENCOUNTER — Ambulatory Visit (HOSPITAL_COMMUNITY): Payer: 59 | Admitting: Certified Registered Nurse Anesthetist

## 2022-06-30 ENCOUNTER — Ambulatory Visit (HOSPITAL_COMMUNITY)
Admission: RE | Admit: 2022-06-30 | Discharge: 2022-06-30 | Disposition: A | Discharge status: Home / Self Care | Payer: 59 | Source: Ambulatory Visit | Attending: Surgery | Admitting: Surgery

## 2022-06-30 DIAGNOSIS — I10 Essential (primary) hypertension: Secondary | ICD-10-CM | POA: Insufficient documentation

## 2022-06-30 DIAGNOSIS — K4090 Unilateral inguinal hernia, without obstruction or gangrene, not specified as recurrent: Secondary | ICD-10-CM | POA: Diagnosis not present

## 2022-06-30 DIAGNOSIS — Z87891 Personal history of nicotine dependence: Secondary | ICD-10-CM | POA: Insufficient documentation

## 2022-06-30 DIAGNOSIS — J449 Chronic obstructive pulmonary disease, unspecified: Secondary | ICD-10-CM | POA: Diagnosis not present

## 2022-06-30 HISTORY — PX: INGUINAL HERNIA REPAIR: SHX194

## 2022-06-30 SURGERY — REPAIR, HERNIA, INGUINAL, ADULT
Anesthesia: General | Laterality: Left

## 2022-06-30 MED ORDER — LIDOCAINE 2% (20 MG/ML) 5 ML SYRINGE
INTRAMUSCULAR | Status: DC | PRN
  Administered 2022-06-30: 80 mg via INTRAVENOUS

## 2022-06-30 MED ORDER — BUPIVACAINE LIPOSOME 1.3 % IJ SUSP
INTRAMUSCULAR | Status: AC
  Filled 2022-06-30: qty 20

## 2022-06-30 MED ORDER — CEFAZOLIN SODIUM-DEXTROSE 2-4 GM/100ML-% IV SOLN
2.0000 g | INTRAVENOUS | Status: AC
  Administered 2022-06-30: 2 g via INTRAVENOUS
  Filled 2022-06-30: qty 100

## 2022-06-30 MED ORDER — LACTATED RINGERS IV SOLN: INTRAVENOUS | Status: DC

## 2022-06-30 MED ORDER — BUPIVACAINE-EPINEPHRINE (PF) 0.25% -1:200000 IJ SOLN
INTRAMUSCULAR | Status: AC
  Filled 2022-06-30: qty 30

## 2022-06-30 MED ORDER — CHLORHEXIDINE GLUCONATE 0.12 % MT SOLN
15.0000 mL | Freq: Once | OROMUCOSAL | Status: AC
  Administered 2022-06-30: 15 mL via OROMUCOSAL

## 2022-06-30 MED ORDER — BUPIVACAINE-EPINEPHRINE 0.25% -1:200000 IJ SOLN
INTRAMUSCULAR | Status: DC | PRN
  Administered 2022-06-30: 30 mL

## 2022-06-30 MED ORDER — GABAPENTIN 300 MG PO CAPS
300.0000 mg | ORAL_CAPSULE | ORAL | Status: AC
  Administered 2022-06-30: 300 mg via ORAL
  Filled 2022-06-30: qty 1

## 2022-06-30 MED ORDER — DEXAMETHASONE SODIUM PHOSPHATE 10 MG/ML IJ SOLN
INTRAMUSCULAR | Status: AC
  Filled 2022-06-30: qty 1

## 2022-06-30 MED ORDER — OXYCODONE HCL 5 MG PO TABS
ORAL_TABLET | ORAL | Status: AC
  Filled 2022-06-30: qty 1

## 2022-06-30 MED ORDER — FENTANYL CITRATE PF 50 MCG/ML IJ SOSY
PREFILLED_SYRINGE | INTRAMUSCULAR | Status: AC
  Filled 2022-06-30: qty 1

## 2022-06-30 MED ORDER — ONDANSETRON HCL 4 MG/2ML IJ SOLN
INTRAMUSCULAR | Status: DC | PRN
  Administered 2022-06-30: 4 mg via INTRAVENOUS

## 2022-06-30 MED ORDER — DOCUSATE SODIUM 100 MG PO CAPS: 100.0000 mg | ORAL_CAPSULE | Freq: Two times a day (BID) | ORAL | 0 refills | Status: AC

## 2022-06-30 MED ORDER — FENTANYL CITRATE PF 50 MCG/ML IJ SOSY: 25.0000 ug | PREFILLED_SYRINGE | INTRAMUSCULAR | Status: DC | PRN

## 2022-06-30 MED ORDER — ONDANSETRON HCL 4 MG/2ML IJ SOLN
INTRAMUSCULAR | Status: AC
  Filled 2022-06-30: qty 2

## 2022-06-30 MED ORDER — 0.9 % SODIUM CHLORIDE (POUR BTL) OPTIME
TOPICAL | Status: DC | PRN
  Administered 2022-06-30: 500 mL

## 2022-06-30 MED ORDER — DEXAMETHASONE SODIUM PHOSPHATE 10 MG/ML IJ SOLN
INTRAMUSCULAR | Status: DC | PRN
  Administered 2022-06-30: 5 mg via INTRAVENOUS

## 2022-06-30 MED ORDER — FENTANYL CITRATE PF 50 MCG/ML IJ SOSY
25.0000 ug | PREFILLED_SYRINGE | INTRAMUSCULAR | Status: DC | PRN
  Administered 2022-06-30 (×2): 50 ug via INTRAVENOUS

## 2022-06-30 MED ORDER — ROCURONIUM BROMIDE 10 MG/ML (PF) SYRINGE
PREFILLED_SYRINGE | INTRAVENOUS | Status: AC
  Filled 2022-06-30: qty 10

## 2022-06-30 MED ORDER — DEXMEDETOMIDINE HCL IN NACL 80 MCG/20ML IV SOLN
INTRAVENOUS | Status: AC
  Filled 2022-06-30: qty 20

## 2022-06-30 MED ORDER — LIDOCAINE HCL (PF) 2 % IJ SOLN
INTRAMUSCULAR | Status: AC
  Filled 2022-06-30: qty 5

## 2022-06-30 MED ORDER — ACETAMINOPHEN 500 MG PO TABS
1000.0000 mg | ORAL_TABLET | ORAL | Status: AC
  Administered 2022-06-30: 1000 mg via ORAL
  Filled 2022-06-30: qty 2

## 2022-06-30 MED ORDER — BUPIVACAINE LIPOSOME 1.3 % IJ SUSP
INTRAMUSCULAR | Status: DC | PRN
  Administered 2022-06-30: 20 mL

## 2022-06-30 MED ORDER — ACETAMINOPHEN 650 MG RE SUPP: 650.0000 mg | RECTAL | Status: DC | PRN

## 2022-06-30 MED ORDER — SODIUM CHLORIDE 0.9 % IV SOLN: 250.0000 mL | INTRAVENOUS | Status: DC | PRN

## 2022-06-30 MED ORDER — OXYCODONE HCL 5 MG PO TABS
5.0000 mg | ORAL_TABLET | ORAL | Status: DC | PRN
  Administered 2022-06-30: 5 mg via ORAL

## 2022-06-30 MED ORDER — FENTANYL CITRATE (PF) 100 MCG/2ML IJ SOLN
INTRAMUSCULAR | Status: DC | PRN
  Administered 2022-06-30: 50 ug via INTRAVENOUS
  Administered 2022-06-30 (×3): 25 ug via INTRAVENOUS

## 2022-06-30 MED ORDER — OXYCODONE HCL 5 MG PO TABS: 5.0000 mg | ORAL_TABLET | Freq: Three times a day (TID) | ORAL | 0 refills | Status: AC | PRN

## 2022-06-30 MED ORDER — CHLORHEXIDINE GLUCONATE 4 % EX LIQD: 60.0000 mL | Freq: Once | CUTANEOUS | Status: DC

## 2022-06-30 MED ORDER — SODIUM CHLORIDE 0.9% FLUSH: 3.0000 mL | Freq: Two times a day (BID) | INTRAVENOUS | Status: DC

## 2022-06-30 MED ORDER — FENTANYL CITRATE (PF) 100 MCG/2ML IJ SOLN
INTRAMUSCULAR | Status: AC
  Filled 2022-06-30: qty 2

## 2022-06-30 MED ORDER — PROPOFOL 10 MG/ML IV BOLUS
INTRAVENOUS | Status: AC
  Filled 2022-06-30: qty 20

## 2022-06-30 MED ORDER — DEXMEDETOMIDINE HCL IN NACL 200 MCG/50ML IV SOLN
INTRAVENOUS | Status: DC | PRN
  Administered 2022-06-30: 8 ug via INTRAVENOUS

## 2022-06-30 MED ORDER — PROPOFOL 10 MG/ML IV BOLUS
INTRAVENOUS | Status: DC | PRN
  Administered 2022-06-30: 130 mg via INTRAVENOUS

## 2022-06-30 MED ORDER — ACETAMINOPHEN 325 MG PO TABS: 650.0000 mg | ORAL_TABLET | ORAL | Status: DC | PRN

## 2022-06-30 MED ORDER — ORAL CARE MOUTH RINSE: 15.0000 mL | Freq: Once | OROMUCOSAL | Status: AC

## 2022-06-30 MED ORDER — BUPIVACAINE LIPOSOME 1.3 % IJ SUSP: 20.0000 mL | Freq: Once | INTRAMUSCULAR | Status: DC

## 2022-06-30 MED ORDER — SODIUM CHLORIDE 0.9% FLUSH: 3.0000 mL | INTRAVENOUS | Status: DC | PRN

## 2022-06-30 MED ORDER — STERILE WATER FOR IRRIGATION IR SOLN: Status: DC | PRN

## 2022-06-30 MED ORDER — ONDANSETRON HCL 4 MG/2ML IJ SOLN: 4.0000 mg | Freq: Once | INTRAMUSCULAR | Status: DC | PRN

## 2022-06-30 SURGICAL SUPPLY — 42 items
BAG COUNTER SPONGE SURGICOUNT (BAG) IMPLANT
BENZOIN TINCTURE PRP APPL 2/3 (GAUZE/BANDAGES/DRESSINGS) ×1 IMPLANT
BLADE SURG 15 STRL LF DISP TIS (BLADE) ×1 IMPLANT
BLADE SURG 15 STRL SS (BLADE) ×1
CHLORAPREP W/TINT 26 (MISCELLANEOUS) ×1 IMPLANT
COVER SURGICAL LIGHT HANDLE (MISCELLANEOUS) ×1 IMPLANT
DRAIN PENROSE 0.5X18 (DRAIN) ×1 IMPLANT
DRAPE LAPAROSCOPIC ABDOMINAL (DRAPES) ×1 IMPLANT
ELECT REM PT RETURN 15FT ADLT (MISCELLANEOUS) ×1 IMPLANT
GAUZE SPONGE 4X4 12PLY STRL (GAUZE/BANDAGES/DRESSINGS) IMPLANT
GLOVE BIO SURGEON STRL SZ 6 (GLOVE) ×1 IMPLANT
GLOVE INDICATOR 6.5 STRL GRN (GLOVE) ×1 IMPLANT
GLOVE SS BIOGEL STRL SZ 6 (GLOVE) ×1 IMPLANT
GOWN STRL REUS W/ TWL LRG LVL3 (GOWN DISPOSABLE) ×1 IMPLANT
GOWN STRL REUS W/ TWL XL LVL3 (GOWN DISPOSABLE) IMPLANT
GOWN STRL REUS W/TWL LRG LVL3 (GOWN DISPOSABLE) ×1
GOWN STRL REUS W/TWL XL LVL3 (GOWN DISPOSABLE)
KIT BASIN OR (CUSTOM PROCEDURE TRAY) ×1 IMPLANT
KIT TURNOVER KIT A (KITS) IMPLANT
MARKER SKIN DUAL TIP RULER LAB (MISCELLANEOUS) ×1 IMPLANT
MESH ULTRAPRO 3X6 7.6X15CM (Mesh General) IMPLANT
NEEDLE HYPO 22GX1.5 SAFETY (NEEDLE) ×1 IMPLANT
PACK BASIC VI WITH GOWN DISP (CUSTOM PROCEDURE TRAY) ×1 IMPLANT
PENCIL SMOKE EVACUATOR (MISCELLANEOUS) IMPLANT
SPIKE FLUID TRANSFER (MISCELLANEOUS) ×1 IMPLANT
SPONGE T-LAP 4X18 ~~LOC~~+RFID (SPONGE) ×1 IMPLANT
STRIP CLOSURE SKIN 1/2X4 (GAUZE/BANDAGES/DRESSINGS) ×1 IMPLANT
SUT ETHIBOND 0 MO6 C/R (SUTURE) ×1 IMPLANT
SUT MNCRL AB 4-0 PS2 18 (SUTURE) ×1 IMPLANT
SUT PDS AB 0 CT1 36 (SUTURE) ×2 IMPLANT
SUT SILK 3 0 (SUTURE) ×1
SUT SILK 3-0 18XBRD TIE 12 (SUTURE) ×1 IMPLANT
SUT VIC AB 3-0 SH 27 (SUTURE) ×2
SUT VIC AB 3-0 SH 27XBRD (SUTURE) ×2 IMPLANT
SUT VICRYL 0 UR6 27IN ABS (SUTURE) IMPLANT
SUT VICRYL 3 0 BR 18  UND (SUTURE) ×1
SUT VICRYL 3 0 BR 18 UND (SUTURE) ×1 IMPLANT
SYR CONTROL 10ML LL (SYRINGE) ×1 IMPLANT
TAPE CLOTH SURG 4X10 WHT LF (GAUZE/BANDAGES/DRESSINGS) IMPLANT
TOWEL OR 17X26 10 PK STRL BLUE (TOWEL DISPOSABLE) ×1 IMPLANT
TOWEL OR NON WOVEN STRL DISP B (DISPOSABLE) ×1 IMPLANT
TRAY FOLEY MTR SLVR 16FR STAT (SET/KITS/TRAYS/PACK) IMPLANT

## 2022-06-30 NOTE — Anesthesia Postprocedure Evaluation (Signed)
Anesthesia Post Note  Patient: Bryan Young  Procedure(s) Performed: OPEN LEFT INGUINAL HERNIA REPAIR WITH MESH (Left)     Patient location during evaluation: PACU Anesthesia Type: General Level of consciousness: awake and alert Pain management: pain level controlled Vital Signs Assessment: post-procedure vital signs reviewed and stable Respiratory status: spontaneous breathing, nonlabored ventilation, respiratory function stable and patient connected to nasal cannula oxygen Cardiovascular status: blood pressure returned to baseline and stable Postop Assessment: no apparent nausea or vomiting Anesthetic complications: no   No notable events documented.  Last Vitals:  Vitals:   06/30/22 1445 06/30/22 1520  BP: (!) 138/91 (!) 147/94  Pulse: (!) 54 60  Resp: 18 16  Temp:  36.4 C  SpO2: 95% 95%    Last Pain:  Vitals:   06/30/22 1520  TempSrc:   PainSc: 0-No pain                 Santa Lighter

## 2022-06-30 NOTE — Transfer of Care (Signed)
Immediate Anesthesia Transfer of Care Note  Patient: Bryan Young  Procedure(s) Performed: OPEN LEFT INGUINAL HERNIA REPAIR WITH MESH (Left)  Patient Location: PACU  Anesthesia Type:General  Level of Consciousness: drowsy  Airway & Oxygen Therapy: Patient Spontanous Breathing and Patient connected to face mask oxygen  Post-op Assessment: Report given to RN and Post -op Vital signs reviewed and stable  Post vital signs: Reviewed and stable  Last Vitals:  Vitals Value Taken Time  BP 148/95 06/30/22 1339  Temp    Pulse 61 06/30/22 1344  Resp 11 06/30/22 1344  SpO2 100 % 06/30/22 1344  Vitals shown include unvalidated device data.  Last Pain:  Vitals:   06/30/22 1042  TempSrc:   PainSc: 0-No pain         Complications: No notable events documented.

## 2022-06-30 NOTE — Anesthesia Preprocedure Evaluation (Addendum)
Anesthesia Evaluation  Patient identified by MRN, date of birth, ID band Patient awake    Reviewed: Allergy & Precautions, NPO status , Patient's Chart, lab work & pertinent test results  History of Anesthesia Complications (+) Family history of anesthesia reaction  Airway Mallampati: II  TM Distance: >3 FB Neck ROM: Full    Dental  (+) Dental Advisory Given, Edentulous Upper, Missing, Poor Dentition   Pulmonary COPD,  COPD inhaler, Current Smoker and Patient abstained from smoking.,    Pulmonary exam normal breath sounds clear to auscultation       Cardiovascular hypertension, Normal cardiovascular exam Rhythm:Regular Rate:Normal     Neuro/Psych negative neurological ROS  negative psych ROS   GI/Hepatic Neg liver ROS, Inguinal hernia    Endo/Other  negative endocrine ROS  Renal/GU negative Renal ROS     Musculoskeletal  (+) Arthritis ,   Abdominal   Peds  Hematology negative hematology ROS (+)   Anesthesia Other Findings Day of surgery medications reviewed with the patient.  Reproductive/Obstetrics                            Anesthesia Physical Anesthesia Plan  ASA: 2  Anesthesia Plan: General   Post-op Pain Management: Tylenol PO (pre-op)*   Induction: Intravenous  PONV Risk Score and Plan: 2 and Dexamethasone and Ondansetron  Airway Management Planned: LMA  Additional Equipment:   Intra-op Plan:   Post-operative Plan: Extubation in OR  Informed Consent: I have reviewed the patients History and Physical, chart, labs and discussed the procedure including the risks, benefits and alternatives for the proposed anesthesia with the patient or authorized representative who has indicated his/her understanding and acceptance.     Dental advisory given  Plan Discussed with: CRNA  Anesthesia Plan Comments:        Anesthesia Quick Evaluation

## 2022-06-30 NOTE — Op Note (Signed)
Operative Note  Bryan Young  [id number]  [id number]  06/30/2022   Surgeon: Romana Juniper MD FACS   Procedure performed: Open left inguinal hernia repair with mesh   Preop diagnosis:  left inguinal hernia   Post-op diagnosis/intraop findings:  large left indirect inguinal hernia   Specimens: none   EBL: 5cc   Complications: none   Description of procedure: After confirming informed consent, the patient was taken to the operating room and placed supine on operating room table where general LMA anesthesia was initiated, preoperative antibiotics were administered, SCDs applied, and a formal timeout was performed. The groin was clipped, prepped and draped in the usual sterile fashion. An oblique incision was made the just above the inguinal ligament after infiltrating the tissues with local anesthetic (Exparel mixed with quarter percent Marcaine with epinephrine). Soft tissues were dissected using electrocautery until the external oblique aponeurosis was encountered. This was divided sharply to expand the external ring. A plane was bluntly developed between the spermatic cord and the external oblique. The ilioinguinal nerve was divided between hemostats and each and ligated with 3-0 Vicryl ties. The spermatic cord was then bluntly dissected away from the pubic tubercle and encircled with a Penrose. Inspection of the inguinal anatomy revealed a large indirect sac. The indirect hernia sac was bluntly dissected away from the cord structures and skeletonized to the level of the internal ring, where it was reduced intact into the abdomen.  The inguinal floor was reconstructed with interrupted 0 PDS, leaving an internal ring just sufficient for the cord structures. A 3 x 6 piece of ultra Pro mesh was brought onto the field and trimmed to approximate the field. This was sutured to the pubic tubercle fascia, inferior shelving edge and to the internal oblique superiorly with interrupted 0 ethibonds. The  tails of the mesh were wrapped around the spermatic cord, ensuring adequate room for the cord, and sutured to each other with 0 ethibond, and then directed laterally to lie flat beneath the external oblique aponeurosis. Hemostasis was ensured within the wound. The Penrose was removed. The external oblique aponeurosis was reapproximated with a running 3-0 Vicryl to re-create a narrowed external ring. More local was infiltrated around the pubic tubercle and in the plane just below the external oblique. The Scarpa's was reapproximated with interrupted 3-0 Vicryls. The skin was closed with a running subcuticular 4-0 Monocryl. The remainder of the local was injected in the subcutaneous and subcuticular space. The field was then cleaned, benzoin and Steri-Strips and sterile bandage were applied. Both testicles were palpated in the scrotum at the end of the case. The patient was then awakened extubated and taken to PACU in stable condition.    All counts were correct at the completion of the case

## 2022-06-30 NOTE — Discharge Instructions (Signed)
HERNIA REPAIR: POST OP INSTRUCTIONS   EAT Gradually transition to a high fiber diet with a fiber supplement over the next few weeks after discharge.  Start with a pureed / full liquid diet (see below)  WALK Walk an hour a day (cumulative- not all at once).  Control your pain to do that.    CONTROL PAIN Control pain so that you can walk, sleep, tolerate sneezing/coughing, and go up/down stairs.  HAVE A BOWEL MOVEMENT DAILY Keep your bowels regular to avoid problems.  OK to try a laxative to override constipation.  OK to use an antidairrheal to slow down diarrhea.  Call if not better after 2 tries  CALL IF YOU HAVE PROBLEMS/CONCERNS Call if you are still struggling despite following these instructions. Call if you have concerns not answered by these instructions  ######################################################################    DIET: Follow a light bland diet & liquids the first 24 hours after arrival home, such as soup, liquids, starches, etc.  Be sure to drink plenty of fluids.  Quickly advance to a usual solid diet within a few days.  Avoid fast food or heavy meals initially as you are more likely to get nauseated or have irregular bowels.  A low-sugar, high-fiber diet for the rest of your life is ideal.   Take your usually prescribed home medications unless otherwise directed.  PAIN CONTROL: Pain is best controlled by a usual combination of three different methods TOGETHER: Ice/Heat Over the counter pain medication Prescription pain medication Most patients will experience some swelling and bruising around the hernia(s) such as the bellybutton, groins, or old incisions.  Ice packs or heating pads (30-60 minutes up to 6 times a day) will help. Use ice for the first few days to help decrease swelling and bruising, then switch to heat to help relax tight/sore spots and speed recovery.  Some people prefer to use ice alone, heat alone, alternating between ice & heat.  Experiment  to what works for you.  Swelling and bruising can take several weeks to resolve.   It is helpful to take an over-the-counter pain medication regularly for the first days: Naproxen (Aleve, etc)  Two 220mg  tabs twice a day OR Ibuprofen (Advil, etc) Three 200mg  tabs four times a day (every meal & bedtime) AND Acetaminophen (Tylenol, etc) 325-650mg  four times a day (every meal & bedtime) A  prescription for pain medication should be given to you upon discharge.  Take your pain medication as prescribed, IF NEEDED.  If you are having problems/concerns with the prescription medicine (does not control pain, nausea, vomiting, rash, itching, etc), please call us [phone] to see if we need to switch you to a different pain medicine that will work better for you and/or control your side effect better. If you need a refill on your pain medication, please contact your pharmacy.  They will contact our office to request authorization. Prescriptions will not be filled after 5 pm or on week-ends.  Avoid getting constipated.  Between the surgery and the pain medications, it is common to experience some constipation.  Increasing fluid intake and taking a fiber supplement (such as Metamucil, Citrucel, FiberCon, MiraLax, etc) 1-2 times a day regularly will usually help prevent this problem from occurring.  A mild laxative (prune juice, Milk of Magnesia, MiraLax, etc) should be taken according to package directions if there are no bowel movements after 48 hours.    Wash / shower every day, starting 2 days after surgery.  You may shower over  the steri strips or skin glue which are waterproof.  No rubbing, scrubbing, lotions or ointments to incision(s). Do not soak or submerge.   Remove your outer bandage 2 days after surgery. Steri strips (if present) will peel off after 1-2 weeks. Glue (if present) will flake off after about 2 weeks.  You may leave the incision open to air.  You may replace a dressing/Band-Aid to cover  an incision for comfort if you wish.  Continue to shower over incision(s) after the dressing is off.  ACTIVITIES as tolerated:   You may resume regular (light) daily activities beginning the next day--such as daily self-care, walking, climbing stairs--gradually increasing activities as tolerated.  Control your pain so that you can walk an hour a day.  If you can walk 30 minutes without difficulty, it is safe to try more intense activity such as jogging, treadmill, bicycling, low-impact aerobics, swimming, etc. Refrain from the most intensive and strenuous activity such as sit-ups, heavy lifting, contact sports, etc  Refrain from any heavy lifting or straining until 6 weeks after surgery.   DO NOT PUSH THROUGH PAIN.  Let pain be your guide: If it hurts to do something, don't do it.  Pain is your body warning you to avoid that activity for another week until the pain goes down. You may drive when you are no longer taking prescription pain medication, you can comfortably wear a seatbelt, and you can safely maneuver your car and apply brakes. You may have sexual intercourse when it is comfortable.   FOLLOW UP in our office Please call [facility] at [phone] to set up an appointment to see your surgeon in the office for a follow-up appointment approximately 2-3 weeks after your surgery. Make sure that you call for this appointment the day you arrive home to insure a convenient appointment time.  9.  If you have disability of FMLA / Family leave forms, please bring the forms to the office for processing.  (do not give to your surgeon).  WHEN TO CALL US [phone]: Poor pain control Reactions / problems with new medications (rash/itching, nausea, etc)  Fever over 101.5 F (38.5 C) Inability to urinate Nausea and/or vomiting Worsening swelling or bruising Continued bleeding from incision. Increased pain, redness, or drainage from the incision   The clinic staff is available to answer your  questions during regular business hours (8:30am-5pm).  Please don't hesitate to call and ask to speak to one of our nurses for clinical concerns.   If you have a medical emergency, go to the nearest emergency room or call 911.  A surgeon from [facility] is always on call at the hospitals in [place]  [facility] [address], [address], [place], [place]  [address] ?  [address] [address], [place], [place]   [address] MAIN: [phone] ? TOLL FREE: [phone] ? FAX: [phone] [email or web address]  

## 2022-06-30 NOTE — Anesthesia Procedure Notes (Signed)
Procedure Name: LMA Insertion Date/Time: 06/30/2022 12:01 PM  Performed by: West Pugh, CRNAPre-anesthesia Checklist: Patient identified, Emergency Drugs available, Suction available, Patient being monitored and Timeout performed Patient Re-evaluated:Patient Re-evaluated prior to induction Oxygen Delivery Method: Circle system utilized Preoxygenation: Pre-oxygenation with 100% oxygen Induction Type: IV induction LMA: LMA with gastric port inserted LMA Size: 4.0 Number of attempts: 1 Placement Confirmation: positive ETCO2 Tube secured with: Tape Dental Injury: Teeth and Oropharynx as per pre-operative assessment

## 2022-06-30 NOTE — H&P (Signed)
    scheduling.     Shizuko Wojdyla Raquel James, MD

## 2022-07-02 ENCOUNTER — Encounter (HOSPITAL_COMMUNITY): Payer: Self-pay | Admitting: Surgery

## 2022-10-27 ENCOUNTER — Other Ambulatory Visit: Payer: Self-pay | Admitting: Registered Nurse

## 2022-10-27 ENCOUNTER — Ambulatory Visit
Admission: RE | Admit: 2022-10-27 | Discharge: 2022-10-27 | Disposition: A | Discharge status: Home / Self Care | Payer: Medicare HMO | Source: Ambulatory Visit | Attending: Registered Nurse | Admitting: Registered Nurse

## 2022-10-27 DIAGNOSIS — R079 Chest pain, unspecified: Secondary | ICD-10-CM

## 2023-03-17 IMAGING — CR DG CHEST 2V
2 series · 2 of 2 positions shown · non-contrast
Comparison: No priors.

CLINICAL DATA: 70-year-old male with history of hypoxia.

EXAM:
CHEST - 2 VIEW

[chest lat]
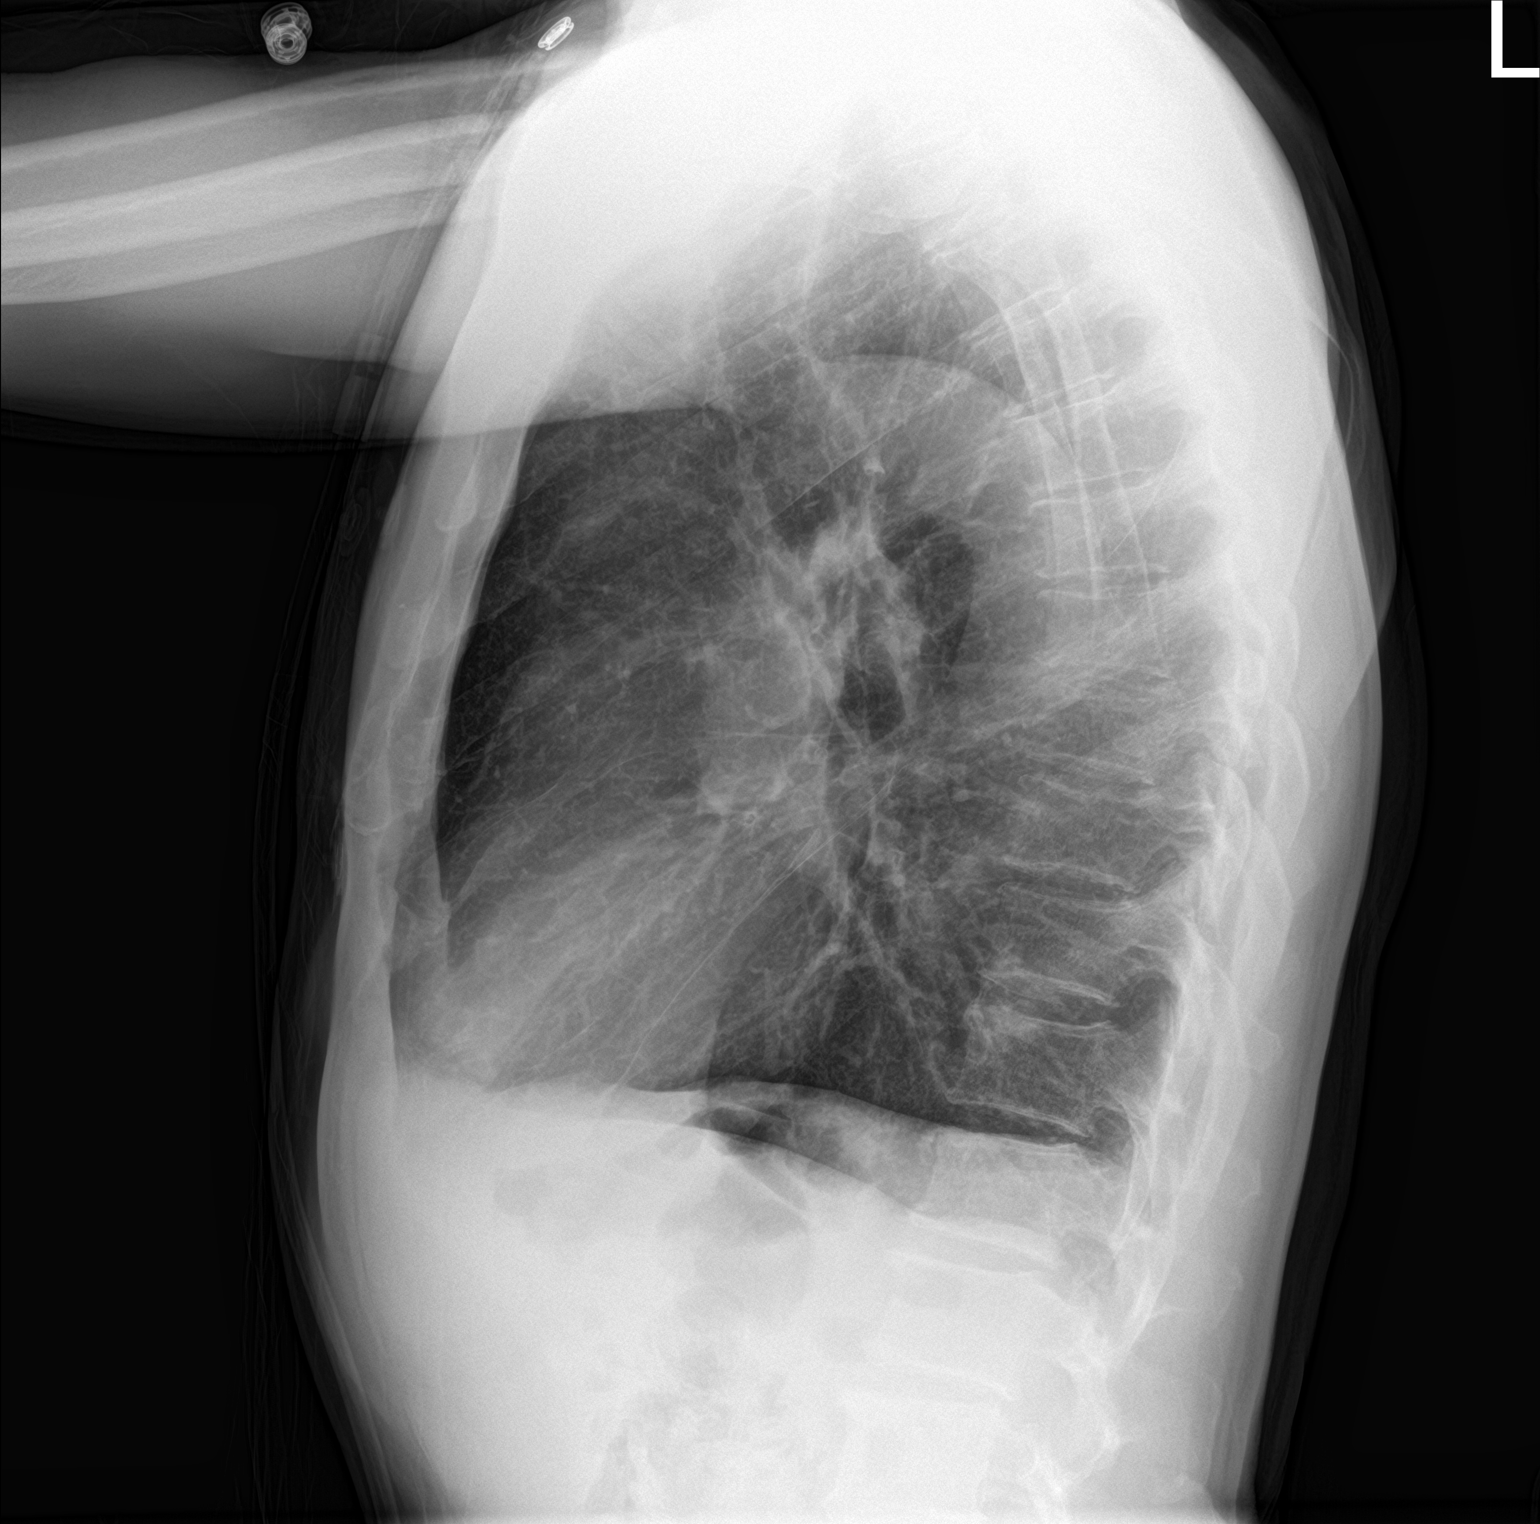

[chest pa]
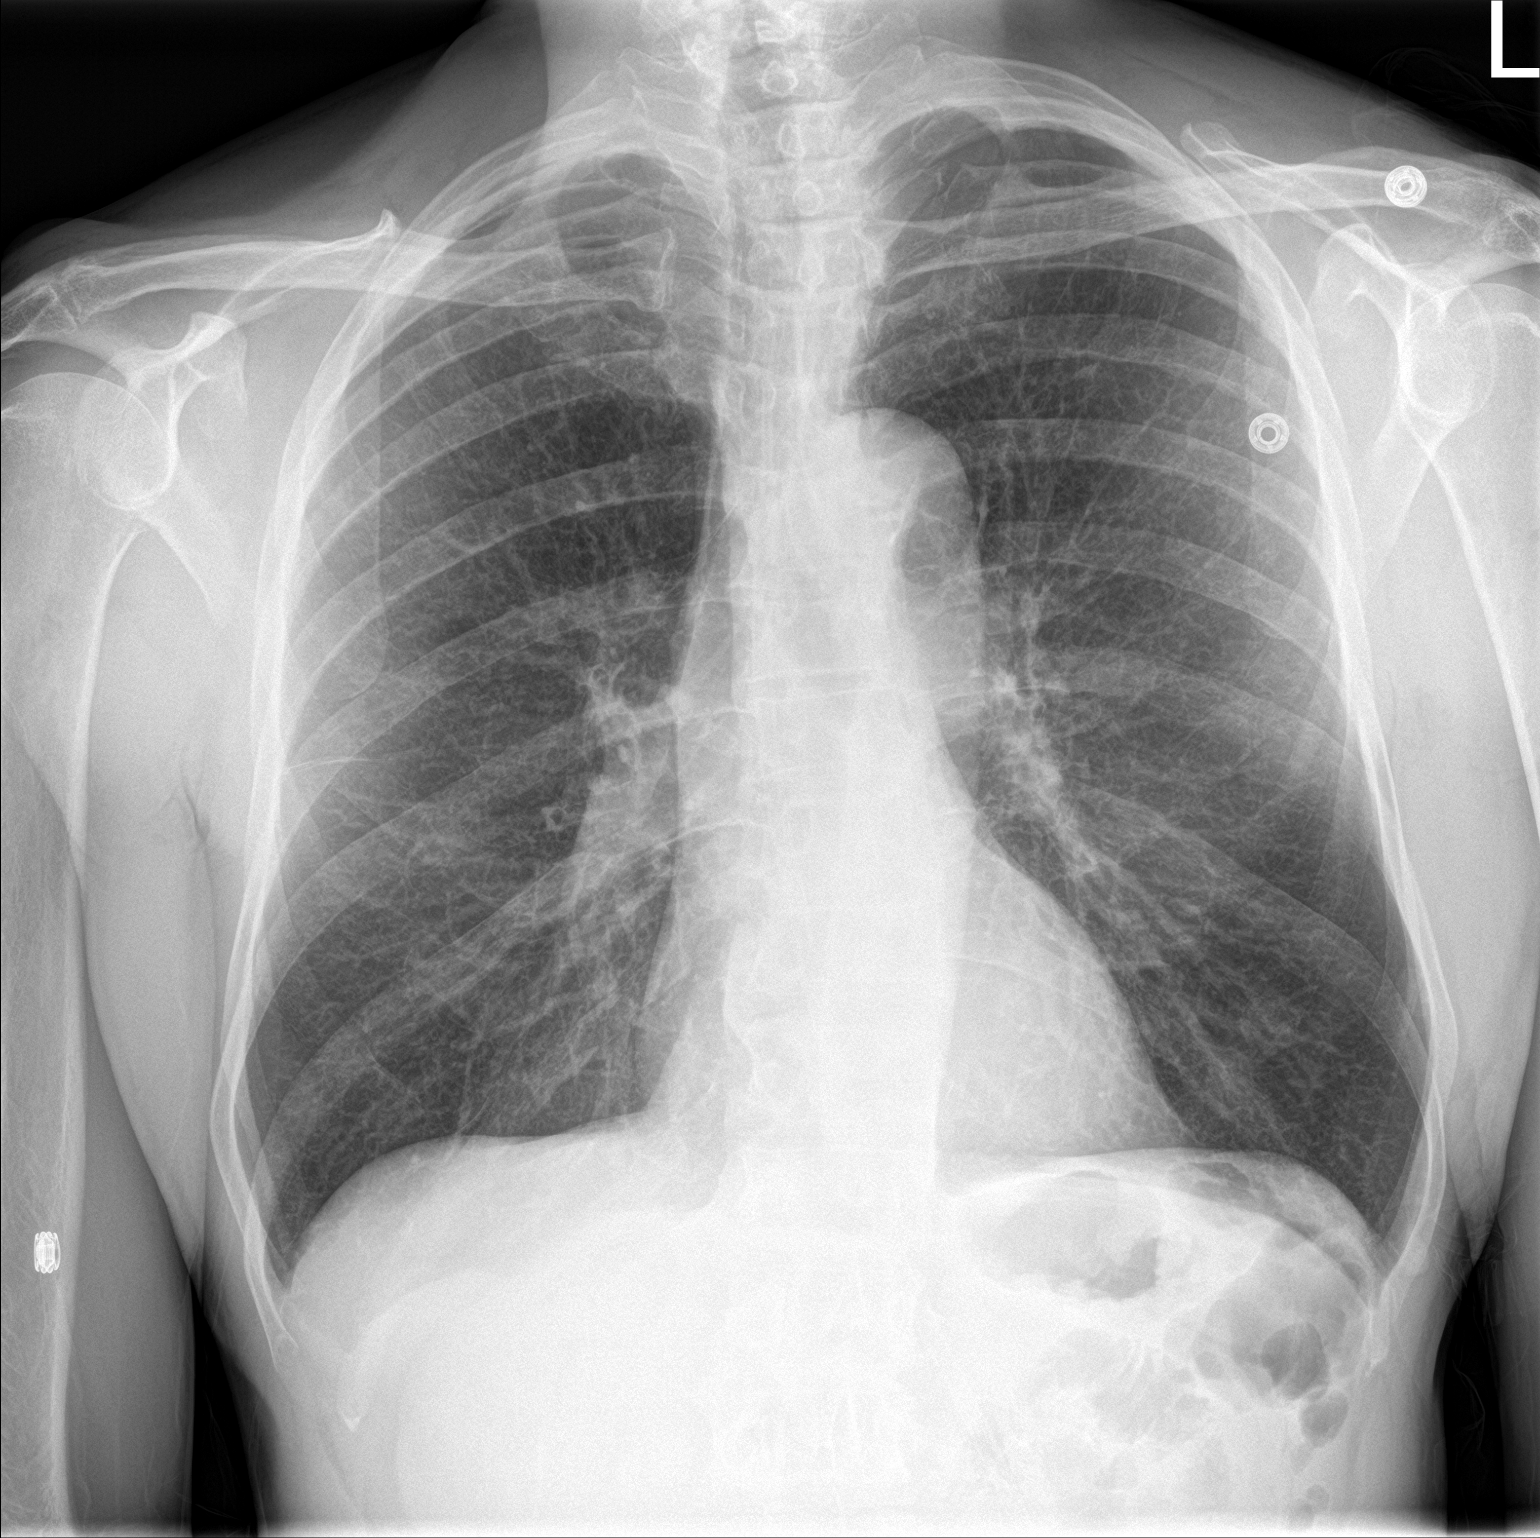

[2 of 2 positions shown; findings below may reference images not displayed]

FINDINGS: Lung volumes are increased, with probable emphysematous changes.
Diffuse peribronchial cuffing. No consolidative airspace disease. No
pleural effusions. No pneumothorax. No pulmonary nodule or mass
noted. Pulmonary vasculature and the cardiomediastinal silhouette
are within normal limits. Atherosclerosis in the thoracic aorta.
IMPRESSION: 1. Mild diffuse peribronchial cuffing which could suggest an acute
bronchitis.
2. Hyperexpansion of the lungs with probable emphysematous changes.

## 2023-10-01 IMAGING — CR DG CHEST 2V
2 series · 2 of 2 positions shown · non-contrast
Comparison: 08/20/2021

CLINICAL DATA: Dry cough for 3 days, left-sided chest pain for 3
weeks

EXAM:
CHEST - 2 VIEW

[w chest pa]
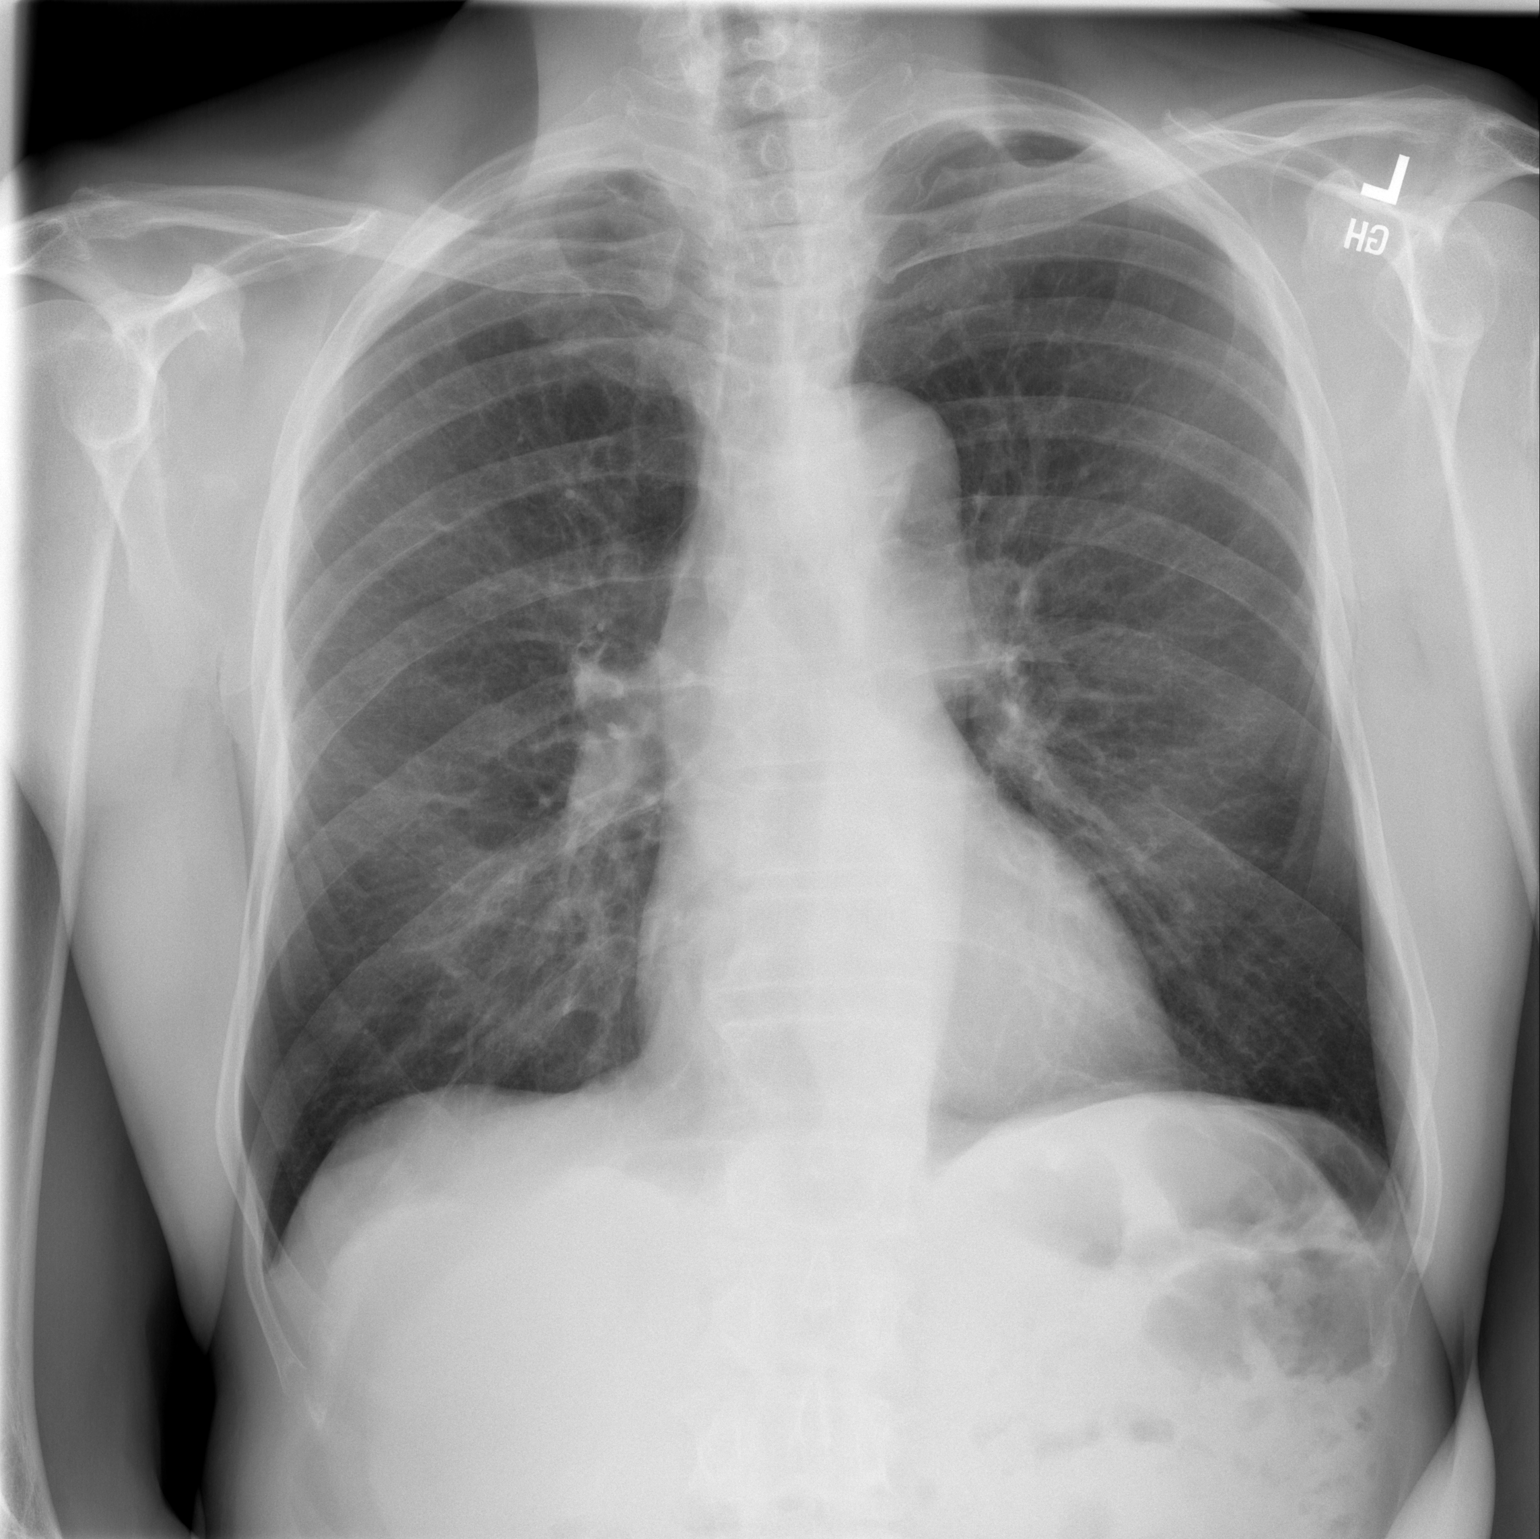

[w chest lat]
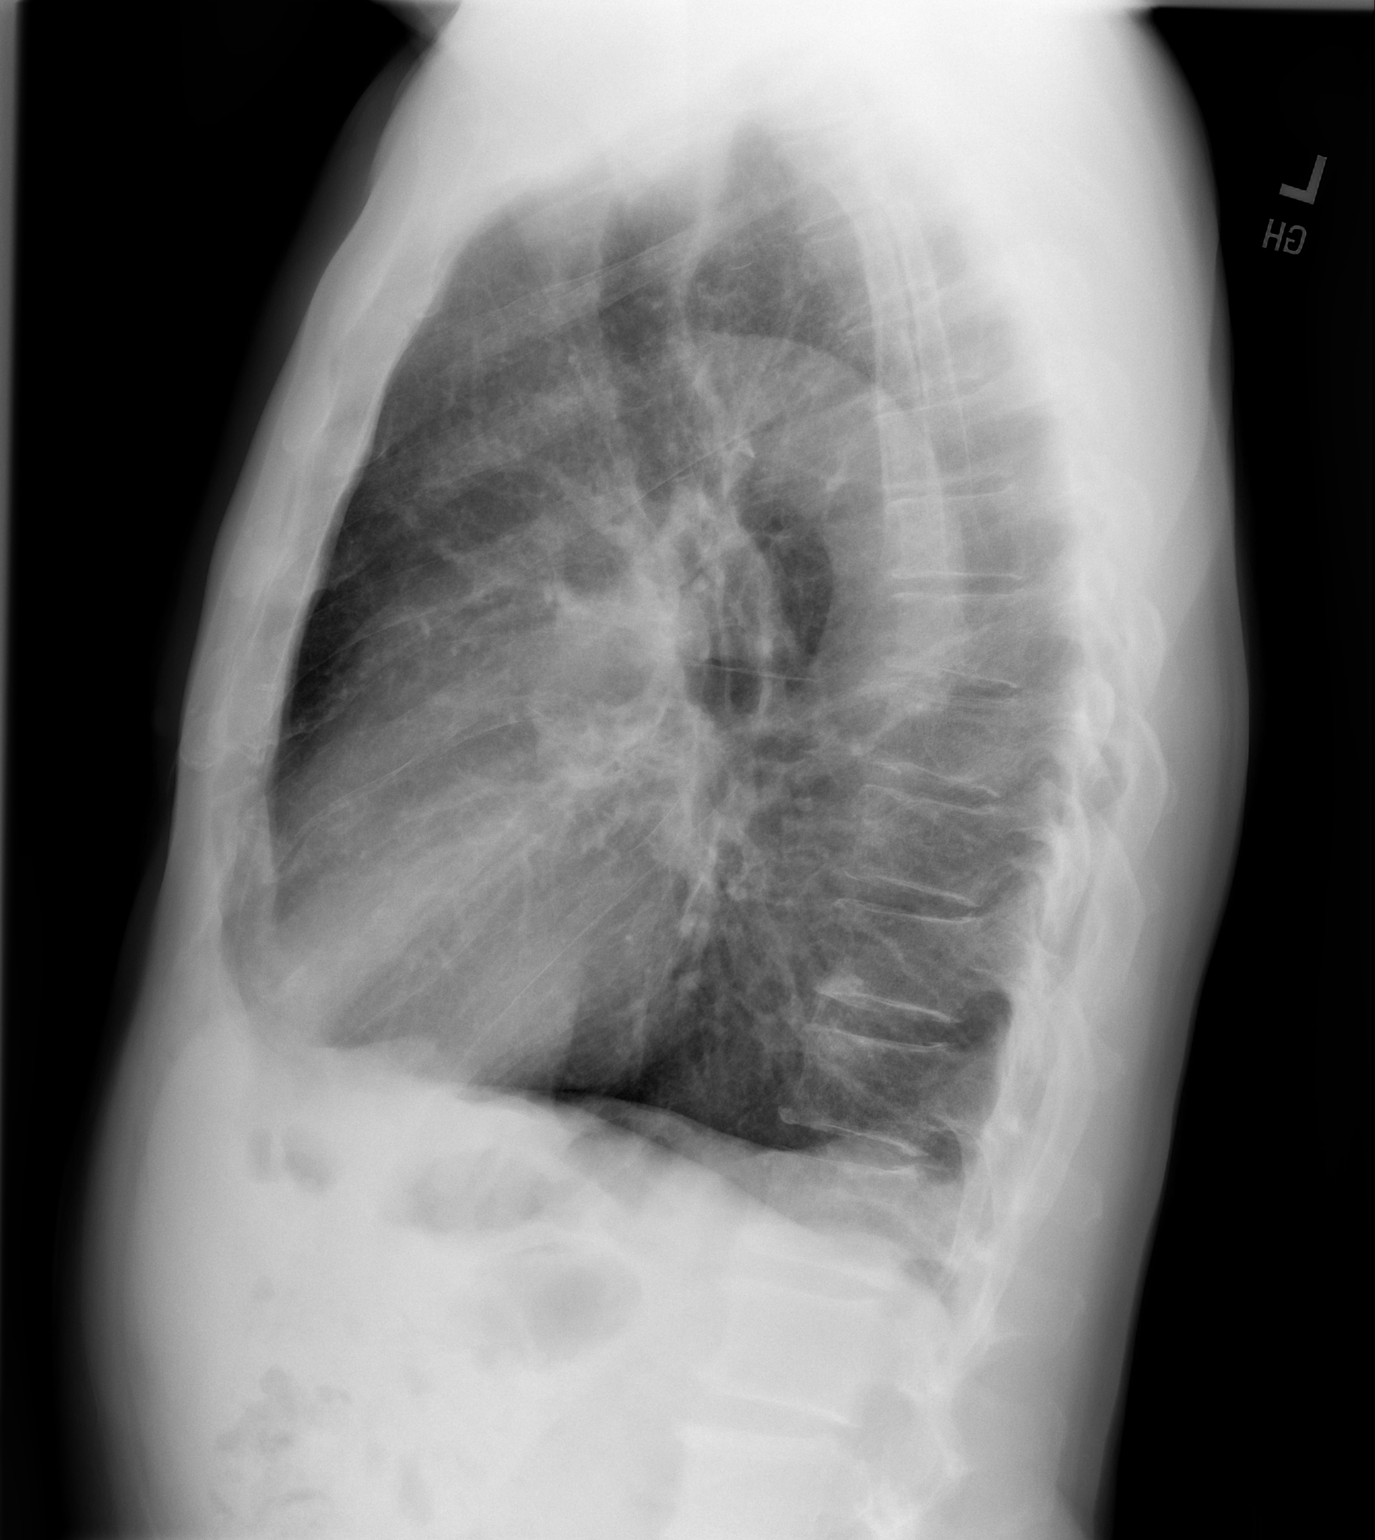

[2 of 2 positions shown; findings below may reference images not displayed]

FINDINGS: Frontal and lateral views of the chest demonstrate a stable cardiac
silhouette. Mild hyperinflation unchanged. No airspace disease,
effusion, or pneumothorax. No acute bony abnormalities.
IMPRESSION: 1. No acute intrathoracic process.

## 2024-01-23 ENCOUNTER — Other Ambulatory Visit: Payer: Self-pay | Admitting: Student

## 2024-01-23 DIAGNOSIS — I70203 Unspecified atherosclerosis of native arteries of extremities, bilateral legs: Secondary | ICD-10-CM

## 2024-01-24 ENCOUNTER — Other Ambulatory Visit: Payer: Self-pay | Admitting: Student

## 2024-01-24 DIAGNOSIS — J439 Emphysema, unspecified: Secondary | ICD-10-CM

## 2024-01-24 DIAGNOSIS — F17211 Nicotine dependence, cigarettes, in remission: Secondary | ICD-10-CM

## 2024-09-03 ENCOUNTER — Telehealth: Payer: Self-pay

## 2024-09-03 NOTE — Telephone Encounter (Signed)
 Spoke to Safaya at [facility] and asked what the paperwork was for that the patient brought into her office. She told me she wasn't entirely sure and she would get the message to the patient's provider and have an answer back to us  by the end of the week. I explained that the patient came into our office requesting to be cleared for surgery today as he explained that to our secretary at the front. She made note of this.

## 2024-10-07 DIAGNOSIS — R9431 Abnormal electrocardiogram [ECG] [EKG]: Secondary | ICD-10-CM | POA: Insufficient documentation

## 2024-10-07 NOTE — Progress Notes (Deleted)
 "    Cardiology Office Note   Date:  10/07/2024   ID:  Bryan Young, Bryan Young 1951-01-19, MRN [id number]  PCP:  No primary care provider on file.  Cardiologist:   None Referring:  ***  No chief complaint on file.     History of Present Illness: Bryan Young is a 74 y.o. male who presents for ***  .  He was referred by [facility].  ***    Past Medical History:  Diagnosis Date   Arthritis    Bronchitis    COPD (chronic obstructive pulmonary disease) (HCC)    Dyspnea    Hypertension     Past Surgical History:  Procedure Laterality Date   INGUINAL HERNIA REPAIR Left 06/30/2022   Procedure: OPEN LEFT INGUINAL HERNIA REPAIR WITH MESH;  Surgeon: Signe Mitzie LABOR, MD;  Location: [facility];  Service: General;  Laterality: Left;   NO PAST SURGERIES       Current Outpatient Medications  Medication Sig Dispense Refill   albuterol  (VENTOLIN  HFA) 108 (90 Base) MCG/ACT inhaler Inhale 2 puffs into the lungs every 6 (six) hours as needed for wheezing or shortness of breath. 8.5 g 2   atorvastatin  (LIPITOR) 40 MG tablet Take 1 tablet (40 mg total) by mouth daily. 30 tablet 2   fluticasone (FLONASE) 50 MCG/ACT nasal spray Place 1 spray into both nostrils daily.     Fluticasone-Umeclidin-Vilant (TRELEGY ELLIPTA) 100-62.5-25 MCG/ACT AEPB Inhale 1 puff into the lungs daily.     tiotropium (SPIRIVA ) 18 MCG inhalation capsule Place 18 mcg into inhaler and inhale daily as needed (shortness of breath).     Tiotropium Bromide  Monohydrate (SPIRIVA  RESPIMAT) 2.5 MCG/ACT AERS Inhale 2 puffs into the lungs daily. (Patient not taking: Reported on 06/19/2022) 4 g 2   No current facility-administered medications for this visit.    Allergies:   Patient has no known allergies.    Social History:  The patient  reports that he has been smoking cigarettes. He started smoking about 60 years ago. He has a 30 pack-year smoking history. He has never used smokeless tobacco. He reports current alcohol use. He  reports that he does not currently use drugs.   Family History:  The patient's ***family history is not on file.    ROS:  Please see the history of present illness.   Otherwise, review of systems are positive for {NONE DEFAULTED:18576}.   All other systems are reviewed and negative.    PHYSICAL EXAM: VS:  There were no vitals taken for this visit. , BMI There is no height or weight on file to calculate BMI. GENERAL:  Well appearing HEENT:  Pupils equal round and reactive, fundi not visualized, oral mucosa unremarkable NECK:  No jugular venous distention, waveform within normal limits, carotid upstroke brisk and symmetric, no bruits, no thyromegaly LYMPHATICS:  No cervical, inguinal adenopathy LUNGS:  Clear to auscultation bilaterally BACK:  No CVA tenderness CHEST:  Unremarkable HEART:  PMI not displaced or sustained,S1 and S2 within normal limits, no S3, no S4, no clicks, no rubs, *** murmurs ABD:  Flat, positive bowel sounds normal in frequency in pitch, no bruits, no rebound, no guarding, no midline pulsatile mass, no hepatomegaly, no splenomegaly EXT:  2 plus pulses throughout, no edema, no cyanosis no clubbing SKIN:  No rashes no nodules NEURO:  Cranial nerves II through XII grossly intact, motor grossly intact throughout PSYCH:  Cognitively intact, oriented to person place and time    EKG:  Recent Labs: No results found for requested labs within last 365 days.    Lipid Panel    Component Value Date/Time   CHOL 210 (H) 08/14/2021 1754   TRIG 33 08/14/2021 1754   HDL 68 08/14/2021 1754   CHOLHDL 3.1 08/14/2021 1754   VLDL 7 08/14/2021 1754   LDLCALC 135 (H) 08/14/2021 1754      Wt Readings from Last 3 Encounters:  06/30/22 169 lb 15.6 oz (77.1 kg)  06/20/22 170 lb (77.1 kg)  09/19/21 160 lb 8 oz (72.8 kg)      Other studies Reviewed: Additional studies/ records that were reviewed today include: ***. Review of the above records demonstrates:  Please see  elsewhere in the note.  ***   ASSESSMENT AND PLAN:  *** Abnormal EKG:  ***   Current medicines are reviewed at length with the patient today.  The patient {ACTIONS; HAS/DOES NOT HAVE:19233} concerns regarding medicines.  The following changes have been made:  {PLAN; NO CHANGE:13088:s}  Labs/ tests ordered today include: *** No orders of the defined types were placed in this encounter.    Disposition:   FU with ***    Signed, Lynwood Schilling, MD  10/07/2024 4:59 PM    [facility]    "

## 2024-10-08 ENCOUNTER — Ambulatory Visit: Attending: Cardiovascular Disease | Admitting: Cardiology

## 2024-10-08 DIAGNOSIS — R9431 Abnormal electrocardiogram [ECG] [EKG]: Secondary | ICD-10-CM

## 2024-11-12 ENCOUNTER — Ambulatory Visit: Admitting: Podiatry
